# Patient Record
Sex: Female | Born: 1965 | Race: White | Hispanic: No | Marital: Married | Smoking: Former smoker
Health system: Southern US, Community
[De-identification: ages and names within clinical notes are randomized; demographics above are authoritative.]

## PROBLEM LIST (undated history)

## (undated) DIAGNOSIS — E559 Vitamin D deficiency, unspecified: Secondary | ICD-10-CM

## (undated) DIAGNOSIS — E039 Hypothyroidism, unspecified: Secondary | ICD-10-CM

## (undated) DIAGNOSIS — IMO0002 Reserved for concepts with insufficient information to code with codable children: Secondary | ICD-10-CM

## (undated) DIAGNOSIS — K829 Disease of gallbladder, unspecified: Secondary | ICD-10-CM

## (undated) DIAGNOSIS — N879 Dysplasia of cervix uteri, unspecified: Secondary | ICD-10-CM

## (undated) DIAGNOSIS — J302 Other seasonal allergic rhinitis: Secondary | ICD-10-CM

## (undated) DIAGNOSIS — Z780 Asymptomatic menopausal state: Secondary | ICD-10-CM

## (undated) DIAGNOSIS — T7840XA Allergy, unspecified, initial encounter: Secondary | ICD-10-CM

## (undated) DIAGNOSIS — F3281 Premenstrual dysphoric disorder: Secondary | ICD-10-CM

## (undated) DIAGNOSIS — S0291XA Unspecified fracture of skull, initial encounter for closed fracture: Secondary | ICD-10-CM

## (undated) DIAGNOSIS — E079 Disorder of thyroid, unspecified: Secondary | ICD-10-CM

## (undated) DIAGNOSIS — F32A Depression, unspecified: Secondary | ICD-10-CM

## (undated) DIAGNOSIS — R0602 Shortness of breath: Secondary | ICD-10-CM

## (undated) HISTORY — DX: Asymptomatic menopausal state: Z78.0

## (undated) HISTORY — DX: Unspecified fracture of skull, initial encounter for closed fracture: S02.91XA

## (undated) HISTORY — DX: Disease of gallbladder, unspecified: K82.9

## (undated) HISTORY — DX: Vitamin D deficiency, unspecified: E55.9

## (undated) HISTORY — DX: Premenstrual dysphoric disorder: F32.81

## (undated) HISTORY — PX: EYE SURGERY: SHX253

## (undated) HISTORY — PX: OTHER SURGICAL HISTORY: SHX169

## (undated) HISTORY — PX: COLPOSCOPY: SHX161

## (undated) HISTORY — DX: Reserved for concepts with insufficient information to code with codable children: IMO0002

## (undated) HISTORY — DX: Other seasonal allergic rhinitis: J30.2

## (undated) HISTORY — DX: Depression, unspecified: F32.A

## (undated) HISTORY — PX: CARPAL TUNNEL RELEASE: SHX101

## (undated) HISTORY — DX: Dysplasia of cervix uteri, unspecified: N87.9

## (undated) HISTORY — DX: Allergy, unspecified, initial encounter: T78.40XA

## (undated) HISTORY — PX: CHOLECYSTECTOMY: SHX55

## (undated) HISTORY — DX: Shortness of breath: R06.02

## (undated) HISTORY — DX: Hypothyroidism, unspecified: E03.9

---

## 1997-10-20 DIAGNOSIS — N879 Dysplasia of cervix uteri, unspecified: Secondary | ICD-10-CM

## 1997-10-20 HISTORY — DX: Dysplasia of cervix uteri, unspecified: N87.9

## 2000-02-14 ENCOUNTER — Other Ambulatory Visit: Admission: RE | Admit: 2000-02-14 | Discharge: 2000-02-14 | Payer: Self-pay | Admitting: Obstetrics and Gynecology

## 2000-05-04 ENCOUNTER — Other Ambulatory Visit: Admission: RE | Admit: 2000-05-04 | Discharge: 2000-05-04 | Payer: Self-pay | Admitting: Obstetrics and Gynecology

## 2000-05-05 ENCOUNTER — Ambulatory Visit (HOSPITAL_COMMUNITY): Admission: RE | Admit: 2000-05-05 | Discharge: 2000-05-05 | Payer: Self-pay | Admitting: Pulmonary Disease

## 2000-05-05 ENCOUNTER — Encounter: Payer: Self-pay | Admitting: Pulmonary Disease

## 2000-11-03 ENCOUNTER — Other Ambulatory Visit: Admission: RE | Admit: 2000-11-03 | Discharge: 2000-11-03 | Payer: Self-pay | Admitting: Obstetrics and Gynecology

## 2001-04-30 ENCOUNTER — Other Ambulatory Visit: Admission: RE | Admit: 2001-04-30 | Discharge: 2001-04-30 | Payer: Self-pay | Admitting: Obstetrics and Gynecology

## 2001-10-22 ENCOUNTER — Other Ambulatory Visit: Admission: RE | Admit: 2001-10-22 | Discharge: 2001-10-22 | Payer: Self-pay | Admitting: Obstetrics and Gynecology

## 2002-01-31 ENCOUNTER — Encounter: Payer: Self-pay | Admitting: *Deleted

## 2002-01-31 ENCOUNTER — Ambulatory Visit (HOSPITAL_COMMUNITY): Admission: RE | Admit: 2002-01-31 | Discharge: 2002-01-31 | Payer: Self-pay | Admitting: *Deleted

## 2002-01-31 ENCOUNTER — Emergency Department (HOSPITAL_COMMUNITY): Admission: EM | Admit: 2002-01-31 | Discharge: 2002-01-31 | Payer: Self-pay | Admitting: *Deleted

## 2002-02-28 ENCOUNTER — Encounter: Payer: Self-pay | Admitting: Surgery

## 2002-03-04 ENCOUNTER — Encounter: Payer: Self-pay | Admitting: Surgery

## 2002-03-04 ENCOUNTER — Ambulatory Visit (HOSPITAL_COMMUNITY): Admission: RE | Admit: 2002-03-04 | Discharge: 2002-03-05 | Payer: Self-pay | Admitting: Surgery

## 2002-03-04 ENCOUNTER — Encounter (INDEPENDENT_AMBULATORY_CARE_PROVIDER_SITE_OTHER): Payer: Self-pay | Admitting: Specialist

## 2002-05-04 ENCOUNTER — Other Ambulatory Visit: Admission: RE | Admit: 2002-05-04 | Discharge: 2002-05-04 | Payer: Self-pay | Admitting: Obstetrics and Gynecology

## 2002-11-23 ENCOUNTER — Other Ambulatory Visit: Admission: RE | Admit: 2002-11-23 | Discharge: 2002-11-23 | Payer: Self-pay | Admitting: Obstetrics and Gynecology

## 2003-02-15 ENCOUNTER — Encounter: Payer: Self-pay | Admitting: Obstetrics and Gynecology

## 2003-02-15 ENCOUNTER — Encounter: Admission: RE | Admit: 2003-02-15 | Discharge: 2003-02-15 | Payer: Self-pay | Admitting: Obstetrics and Gynecology

## 2003-08-01 ENCOUNTER — Other Ambulatory Visit: Admission: RE | Admit: 2003-08-01 | Discharge: 2003-08-01 | Payer: Self-pay | Admitting: Gynecology

## 2003-12-11 ENCOUNTER — Emergency Department (HOSPITAL_COMMUNITY): Admission: EM | Admit: 2003-12-11 | Discharge: 2003-12-11 | Payer: Self-pay | Admitting: Emergency Medicine

## 2004-08-21 ENCOUNTER — Other Ambulatory Visit: Admission: RE | Admit: 2004-08-21 | Discharge: 2004-08-21 | Payer: Self-pay | Admitting: Obstetrics and Gynecology

## 2005-03-19 ENCOUNTER — Ambulatory Visit: Payer: Self-pay | Admitting: Pulmonary Disease

## 2005-09-02 ENCOUNTER — Other Ambulatory Visit: Admission: RE | Admit: 2005-09-02 | Discharge: 2005-09-02 | Payer: Self-pay | Admitting: Obstetrics and Gynecology

## 2006-09-09 ENCOUNTER — Other Ambulatory Visit: Admission: RE | Admit: 2006-09-09 | Discharge: 2006-09-09 | Payer: Self-pay | Admitting: Addiction Medicine

## 2007-11-02 ENCOUNTER — Other Ambulatory Visit: Admission: RE | Admit: 2007-11-02 | Discharge: 2007-11-02 | Payer: Self-pay | Admitting: Obstetrics and Gynecology

## 2007-12-30 ENCOUNTER — Encounter: Payer: Self-pay | Admitting: Adult Health

## 2008-02-18 DIAGNOSIS — F411 Generalized anxiety disorder: Secondary | ICD-10-CM | POA: Insufficient documentation

## 2008-02-18 DIAGNOSIS — E039 Hypothyroidism, unspecified: Secondary | ICD-10-CM | POA: Insufficient documentation

## 2008-02-21 ENCOUNTER — Ambulatory Visit: Payer: Self-pay | Admitting: Internal Medicine

## 2008-02-21 ENCOUNTER — Encounter: Payer: Self-pay | Admitting: Adult Health

## 2008-02-21 DIAGNOSIS — R209 Unspecified disturbances of skin sensation: Secondary | ICD-10-CM | POA: Insufficient documentation

## 2008-02-21 DIAGNOSIS — R5383 Other fatigue: Secondary | ICD-10-CM

## 2008-02-21 DIAGNOSIS — R5381 Other malaise: Secondary | ICD-10-CM

## 2008-02-22 ENCOUNTER — Encounter: Admission: RE | Admit: 2008-02-22 | Discharge: 2008-02-22 | Payer: Self-pay | Admitting: Obstetrics and Gynecology

## 2008-02-23 LAB — CONVERTED CEMR LAB: Vit D, 1,25-Dihydroxy: 32 (ref 30–89)

## 2008-02-24 LAB — CONVERTED CEMR LAB
Albumin: 3.6 g/dL (ref 3.5–5.2)
BUN: 10 mg/dL (ref 6–23)
Basophils Absolute: 0 10*3/uL (ref 0.0–0.1)
Basophils Relative: 0.1 % (ref 0.0–1.0)
Calcium: 9.2 mg/dL (ref 8.4–10.5)
Creatinine, Ser: 0.6 mg/dL (ref 0.4–1.2)
Eosinophils Absolute: 0.2 10*3/uL (ref 0.0–0.7)
GFR calc Af Amer: 143 mL/min
Glucose, Bld: 94 mg/dL (ref 70–99)
Hemoglobin: 14.3 g/dL (ref 12.0–15.0)
MCHC: 34.4 g/dL (ref 30.0–36.0)
MCV: 88.2 fL (ref 78.0–100.0)
Neutro Abs: 4 10*3/uL (ref 1.4–7.7)
RBC: 4.72 M/uL (ref 3.87–5.11)
RDW: 11.4 % — ABNORMAL LOW (ref 11.5–14.6)
Sed Rate: 21 mm/hr (ref 0–22)
Total Protein: 7.2 g/dL (ref 6.0–8.3)
Vitamin B-12: 758 pg/mL (ref 211–911)

## 2008-11-08 ENCOUNTER — Ambulatory Visit: Payer: Self-pay | Admitting: Obstetrics and Gynecology

## 2008-11-08 ENCOUNTER — Encounter: Payer: Self-pay | Admitting: Obstetrics and Gynecology

## 2008-11-08 ENCOUNTER — Other Ambulatory Visit: Admission: RE | Admit: 2008-11-08 | Discharge: 2008-11-08 | Payer: Self-pay | Admitting: Obstetrics and Gynecology

## 2009-01-08 ENCOUNTER — Encounter: Payer: Self-pay | Admitting: Pulmonary Disease

## 2009-02-27 ENCOUNTER — Encounter: Payer: Self-pay | Admitting: Pulmonary Disease

## 2009-03-01 ENCOUNTER — Encounter: Admission: RE | Admit: 2009-03-01 | Discharge: 2009-03-01 | Payer: Self-pay | Admitting: Obstetrics and Gynecology

## 2009-05-15 ENCOUNTER — Encounter: Payer: Self-pay | Admitting: Pulmonary Disease

## 2009-07-24 ENCOUNTER — Encounter: Payer: Self-pay | Admitting: Pulmonary Disease

## 2009-11-09 ENCOUNTER — Other Ambulatory Visit: Admission: RE | Admit: 2009-11-09 | Discharge: 2009-11-09 | Payer: Self-pay | Admitting: Obstetrics and Gynecology

## 2009-11-09 ENCOUNTER — Ambulatory Visit: Payer: Self-pay | Admitting: Obstetrics and Gynecology

## 2010-03-06 ENCOUNTER — Encounter: Admission: RE | Admit: 2010-03-06 | Discharge: 2010-03-06 | Payer: Self-pay | Admitting: Obstetrics and Gynecology

## 2010-11-12 ENCOUNTER — Ambulatory Visit
Admission: RE | Admit: 2010-11-12 | Discharge: 2010-11-12 | Payer: Self-pay | Source: Home / Self Care | Attending: Obstetrics and Gynecology | Admitting: Obstetrics and Gynecology

## 2010-11-12 ENCOUNTER — Other Ambulatory Visit: Payer: Self-pay | Admitting: Obstetrics and Gynecology

## 2010-11-12 ENCOUNTER — Other Ambulatory Visit
Admission: RE | Admit: 2010-11-12 | Discharge: 2010-11-12 | Payer: Self-pay | Source: Home / Self Care | Admitting: Obstetrics and Gynecology

## 2011-02-26 ENCOUNTER — Other Ambulatory Visit: Payer: Self-pay | Admitting: Obstetrics and Gynecology

## 2011-02-26 DIAGNOSIS — Z1231 Encounter for screening mammogram for malignant neoplasm of breast: Secondary | ICD-10-CM

## 2011-03-07 NOTE — Procedures (Signed)
[place]  Patient:    Linda Berger, Linda Berger Visit Number: [id number] MRN: [id number]          Service Type: DSU Attending Physician:  Valarie Merino Md Dictated by:   Thornton Park Martin,M.D. Proc. Date: 03/04/02 Admit Date:  03/04/2002 Discharge Date: 03/05/2002   CC:         [name], M.D. [facility]   Procedure Report  CCS# [id number]  PREOPERATIVE DIAGNOSIS:  History of cholecystitis with cholelithiasis.  POSTOPERATIVE DIAGNOSIS:  History of cholecystitis with cholelithiasis.  OPERATION PERFORMED:  Laparoscopic cholecystectomy with intraoperative cholangiogram.  SURGEON:  Thornton Park. Daphine Deutscher, M.D.  ASSISTANT:  Sharlet Salina T. Hoxworth, M.D.  ANESTHESIA:  General endotracheal.  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  Liver function studies were unremarkable at that time.  She was seen in the office on February 01, 2002 and informed consent was obtained regarding lap and open cholecystectomy.  DESCRIPTION OF PROCEDURE:  With the patient asleep, the abdomen was prepped with Betadine and draped sterilely.  A longitudinal incision was made in the umbilicus and through this, a suture was placed to hold the Hasson in place and this was inserted without difficulty.  Three other trocars were placed in the upperabdomen.  The gallbladder was grasped and elevated and Calots triangle was dissected and a clip was placed up on the gallbladder.  This was incised.  A Reddick catheter was inserted into the cystic duct.  A dynamic cholangiogram was performed which showed a small cystic duct with filling of a small common duct with free flow into the duodenum and good intrahepatic filling demonstrating right and left intrahepatic ducts.  No evidence of any filling defects.  The cystic  duct was then triple clipped and divided.The cystic artery was double clipped and divided and the gallbladder wasremoved without entering it from the gallbladder bed.  Hemostasis was achieved throughout the dissection.  No bile leaks were noted as the area was irrigated at the end of the cholecystectomy.  The gallbladder was brought out through the umbilicus without difficulty.  The umbilical port site had been created through a small umbilical hernia which had been enlarged to accommodate the trocar.  This hole was repaired using the Endocloseand a 0 Vicryl suture which was placed in a simple fashion which obliterated the defect.  The port sites were injected with 0.5% Marcaine and theskin was closed with 4-0 Vicryl subcuticular with benzoin and Steri-Strips.  The patient seemed to tolerate the procedure well and was taken to the recovery room in satisfactory condition. Dictated by:   Thornton Park Daphine Deutscher, M.D. Attending Physician:  Valarie Merino Md DD:  03/04/02 TD:  03/07/02 Job: [id number]

## 2011-03-11 ENCOUNTER — Ambulatory Visit: Payer: Self-pay

## 2011-03-25 ENCOUNTER — Other Ambulatory Visit (INDEPENDENT_AMBULATORY_CARE_PROVIDER_SITE_OTHER): Payer: BC Managed Care – PPO

## 2011-03-25 ENCOUNTER — Ambulatory Visit
Admission: RE | Admit: 2011-03-25 | Discharge: 2011-03-25 | Disposition: A | Discharge status: Home / Self Care | Payer: BC Managed Care – PPO | Source: Ambulatory Visit | Attending: Obstetrics and Gynecology | Admitting: Obstetrics and Gynecology

## 2011-03-25 DIAGNOSIS — Z1231 Encounter for screening mammogram for malignant neoplasm of breast: Secondary | ICD-10-CM

## 2011-03-25 DIAGNOSIS — E039 Hypothyroidism, unspecified: Secondary | ICD-10-CM

## 2011-03-27 ENCOUNTER — Other Ambulatory Visit: Payer: Self-pay | Admitting: Obstetrics and Gynecology

## 2011-03-27 DIAGNOSIS — Z1231 Encounter for screening mammogram for malignant neoplasm of breast: Secondary | ICD-10-CM

## 2011-04-08 ENCOUNTER — Inpatient Hospital Stay (INDEPENDENT_AMBULATORY_CARE_PROVIDER_SITE_OTHER)
Admission: RE | Admit: 2011-04-08 | Discharge: 2011-04-08 | Disposition: A | Discharge status: Home / Self Care | Payer: BC Managed Care – PPO | Source: Ambulatory Visit

## 2011-04-08 DIAGNOSIS — K5289 Other specified noninfective gastroenteritis and colitis: Secondary | ICD-10-CM

## 2011-04-08 LAB — POCT I-STAT, CHEM 8
Creatinine, Ser: 0.6 mg/dL (ref 0.50–1.10)
HCT: 47 % — ABNORMAL HIGH (ref 36.0–46.0)
Hemoglobin: 16 g/dL — ABNORMAL HIGH (ref 12.0–15.0)
Sodium: 136 mEq/L (ref 135–145)
TCO2: 17 mmol/L (ref 0–100)

## 2011-04-10 ENCOUNTER — Ambulatory Visit: Payer: BC Managed Care – PPO

## 2011-10-17 ENCOUNTER — Emergency Department (HOSPITAL_COMMUNITY)
Admission: EM | Admit: 2011-10-17 | Discharge: 2011-10-17 | Disposition: A | Discharge status: Home / Self Care | Payer: BC Managed Care – PPO | Source: Home / Self Care

## 2011-10-17 DIAGNOSIS — J209 Acute bronchitis, unspecified: Secondary | ICD-10-CM

## 2011-10-17 HISTORY — DX: Disorder of thyroid, unspecified: E07.9

## 2011-10-17 MED ORDER — PROMETHAZINE-CODEINE 6.25-10 MG/5ML PO SYRP: ORAL_SOLUTION | ORAL | Status: AC

## 2011-10-17 MED ORDER — ALBUTEROL SULFATE HFA 108 (90 BASE) MCG/ACT IN AERS: 2.0000 | INHALATION_SPRAY | RESPIRATORY_TRACT | Status: DC | PRN

## 2011-10-17 NOTE — ED Notes (Signed)
C/o nonproductive cough, headache, pressure behind eyes, bil. Ear pain, chills and clear secretions from nose since 10/14/11.  Taking mucinex D and DM along with tylenol for sx.

## 2011-10-17 NOTE — ED Provider Notes (Signed)
History     CSN: [id number]  Arrival date & time 10/17/11  1478   None     Chief Complaint  Patient presents with  . Cough  . Headache  . Otalgia    (Consider location/radiation/quality/duration/timing/severity/associated sxs/prior treatment) HPI Comments: Onset of cough 2 days ago. Cough is nonproductive. No relief with otc cough products, and is disruptive to sleep at night. Pt also has chills, but no fever, frontal HA - worse with cough, and mild clear nasal congestion. No myalgias. No hx of asthma. She denies dyspnea or wheezing.    The history is provided by the patient.    Past Medical History  Diagnosis Date  . Thyroid disease     Past Surgical History  Procedure Date  . Cholecystectomy   . Eye surgery     History reviewed. No pertinent family history.  History  Substance Use Topics  . Smoking status: Never Smoker   . Smokeless tobacco: Not on file  . Alcohol Use: Yes     social    OB History    Grav Para Term Preterm Abortions TAB SAB Ect Mult Living                  Review of Systems  Constitutional: Positive for chills and fatigue. Negative for fever.  HENT: Positive for ear pain and congestion. Negative for sore throat, rhinorrhea, postnasal drip and sinus pressure.   Respiratory: Positive for cough. Negative for shortness of breath and wheezing.   Cardiovascular: Negative for chest pain.    Allergies  Aspirin and Erythromycin  Home Medications   Current Outpatient Rx  Name Route Sig Dispense Refill  . DROSPIRENONE-ETHINYL ESTRADIOL 3-0.02 MG PO TABS Oral Take 1 tablet by mouth daily.      Marland Kitchen LEVOTHYROXINE SODIUM 112 MCG PO TABS Oral Take 112 mcg by mouth daily.      . ALBUTEROL SULFATE HFA 108 (90 BASE) MCG/ACT IN AERS Inhalation Inhale 2 puffs into the lungs every 4 (four) hours as needed for wheezing. 1 Inhaler 0  . PROMETHAZINE-CODEINE 6.25-10 MG/5ML PO SYRP  1-2 tsp every 6 hrs prn cough 120 mL 0    BP 118/86  Pulse 108  Temp(Src)  98.4 F (36.9 C) (Oral)  Resp 18  SpO2 98%  Physical Exam  Nursing note and vitals reviewed. Constitutional: She appears well-developed and well-nourished. No distress.       NAD, does appear ill. Deep  cough noted frequently during visit.   HENT:  Head: Normocephalic and atraumatic.  Right Ear: Tympanic membrane, external ear and ear canal normal.  Left Ear: Tympanic membrane, external ear and ear canal normal.  Nose: Nose normal.  Mouth/Throat: Uvula is midline, oropharynx is clear and moist and mucous membranes are normal. No oropharyngeal exudate, posterior oropharyngeal edema or posterior oropharyngeal erythema.  Neck: Neck supple.  Cardiovascular: Normal rate, regular rhythm and normal heart sounds.   Pulmonary/Chest: Effort normal and breath sounds normal. No respiratory distress.  Lymphadenopathy:    She has no cervical adenopathy.  Neurological: She is alert.  Skin: Skin is warm and dry.  Psychiatric: She has a normal mood and affect.    ED Course  Procedures (including critical care time)  Labs Reviewed - No data to display No results found.   1. Acute bronchitis       MDM  Nonproductive cough, afebrile. VSS, Lungs CTA.         Melody Comas, [place] 10/17/11 [phone]

## 2011-10-21 NOTE — ED Provider Notes (Signed)
Medical screening examination/treatment/procedure(s) were performed by non-physician practitioner and as supervising physician I was immediately available for consultation/collaboration.   KINDL,JAMES DOUGLAS MD.    James Douglas Kindl, MD 10/21/11 1401 

## 2011-12-02 ENCOUNTER — Encounter: Payer: Self-pay | Admitting: Gynecology

## 2011-12-02 ENCOUNTER — Other Ambulatory Visit: Payer: Self-pay | Admitting: Obstetrics and Gynecology

## 2011-12-02 DIAGNOSIS — S0291XA Unspecified fracture of skull, initial encounter for closed fracture: Secondary | ICD-10-CM | POA: Insufficient documentation

## 2011-12-09 ENCOUNTER — Other Ambulatory Visit (HOSPITAL_COMMUNITY)
Admission: RE | Admit: 2011-12-09 | Discharge: 2011-12-09 | Disposition: A | Discharge status: Home / Self Care | Payer: BC Managed Care – PPO | Source: Ambulatory Visit | Attending: Obstetrics and Gynecology | Admitting: Obstetrics and Gynecology

## 2011-12-09 ENCOUNTER — Ambulatory Visit (INDEPENDENT_AMBULATORY_CARE_PROVIDER_SITE_OTHER): Payer: BC Managed Care – PPO | Admitting: Obstetrics and Gynecology

## 2011-12-09 ENCOUNTER — Encounter: Payer: Self-pay | Admitting: Obstetrics and Gynecology

## 2011-12-09 ENCOUNTER — Ambulatory Visit
Admission: RE | Admit: 2011-12-09 | Discharge: 2011-12-09 | Disposition: A | Discharge status: Home / Self Care | Payer: BC Managed Care – PPO | Source: Ambulatory Visit | Attending: Obstetrics and Gynecology | Admitting: Obstetrics and Gynecology

## 2011-12-09 VITALS — BP 126/80 | Ht 67.0 in | Wt 204.0 lb

## 2011-12-09 DIAGNOSIS — E039 Hypothyroidism, unspecified: Secondary | ICD-10-CM

## 2011-12-09 DIAGNOSIS — Z01419 Encounter for gynecological examination (general) (routine) without abnormal findings: Secondary | ICD-10-CM

## 2011-12-09 DIAGNOSIS — N879 Dysplasia of cervix uteri, unspecified: Secondary | ICD-10-CM | POA: Insufficient documentation

## 2011-12-09 DIAGNOSIS — Z1231 Encounter for screening mammogram for malignant neoplasm of breast: Secondary | ICD-10-CM

## 2011-12-09 LAB — URINALYSIS W MICROSCOPIC + REFLEX CULTURE
Bilirubin Urine: NEGATIVE
Protein, ur: NEGATIVE mg/dL
Urobilinogen, UA: 0.2 mg/dL (ref 0.0–1.0)

## 2011-12-09 MED ORDER — DROSPIRENONE-ETHINYL ESTRADIOL 3-0.02 MG PO TABS: ORAL_TABLET | ORAL | Status: DC

## 2011-12-09 MED ORDER — ALPRAZOLAM 0.25 MG PO TABS: 0.2500 mg | ORAL_TABLET | Freq: Every evening | ORAL | Status: AC | PRN

## 2011-12-09 MED ORDER — LEVOTHYROXINE SODIUM 112 MCG PO TABS: 112.0000 ug | ORAL_TABLET | Freq: Every day | ORAL | Status: DC

## 2011-12-09 NOTE — Progress Notes (Signed)
Patient came to see me today for her annual GYN exam. She takes are birth-control pills continuously and has no periods. She is having more trouble with PMDD and requested medication. She had previously tried Zoloft but found that it affected sexual function. She would like to do something on a when necessary basis rather than daily. She is having her mammogram today. We are treating her for hypothyroidism. She is doing well on her current dose is Synthroid. She has had all her lab work done at work except for [employer].  Physical examination: Kennon Portela present. HEENT within normal limits. Neck: Thyroid not large. No masses. Supraclavicular nodes: not enlarged. Breasts: Examined in both sitting midline position. No skin changes and no masses. Abdomen: Soft no guarding rebound or masses or hernia. Pelvic: External: Within normal limits. BUS: Within normal limits. Vaginal:within normal limits. Good estrogen effect. No evidence of cystocele rectocele or enterocele. Cervix: clean. Uterus: Normal size and shape. Adnexa: No masses. Rectovaginal exam: Confirmatory and negative. Extremities: Within normal limits.  Assessment: PMDD. Hypothyroidism.  Plan: Had a repeat conversation with the patient about Yaz an increased risk of DVT. She would like to continue as is and accepts the risks. We started her on Xanax 0.25 mg as needed. TSH checked. Continue Synthroid. Mammogram.

## 2011-12-10 LAB — TSH: TSH: 0.956 u[IU]/mL (ref 0.350–4.500)

## 2011-12-12 ENCOUNTER — Other Ambulatory Visit: Payer: Self-pay | Admitting: *Deleted

## 2011-12-12 MED ORDER — DROSPIRENONE-ETHINYL ESTRADIOL 3-0.02 MG PO TABS: ORAL_TABLET | ORAL | Status: DC

## 2012-05-26 ENCOUNTER — Telehealth: Payer: Self-pay | Admitting: *Deleted

## 2012-05-26 MED ORDER — LEVOTHYROXINE SODIUM 125 MCG PO TABS: 125.0000 ug | ORAL_TABLET | Freq: Every day | ORAL | Status: DC

## 2012-05-26 NOTE — Telephone Encounter (Signed)
PT HAS HER LAB WORK DONE AT [facility] AND YOU KEEP TRACK OF HER TSH LEVEL. PT CALLED TO LET YOU KNOW TSH ELEVATED AT 7.910 SHE IS CURRENTLY TAKING SYNTHROID 112 MCG DAILY. PT ASKED IF RX SHOULD BE INCREASED? PLEASE ADVISE

## 2012-05-26 NOTE — Telephone Encounter (Signed)
I would increase it to 125 MCG daily. She needs followup TSH in 10 weeks.

## 2012-05-26 NOTE — Telephone Encounter (Signed)
PT INFORMED WITH THE BELOW NOTE, RX SENT, RECALL LETTER IN COMPUTER.

## 2012-07-20 ENCOUNTER — Other Ambulatory Visit: Payer: Self-pay | Admitting: *Deleted

## 2012-07-20 DIAGNOSIS — E039 Hypothyroidism, unspecified: Secondary | ICD-10-CM

## 2012-07-21 ENCOUNTER — Other Ambulatory Visit: Payer: Self-pay | Admitting: *Deleted

## 2012-07-21 DIAGNOSIS — E039 Hypothyroidism, unspecified: Secondary | ICD-10-CM

## 2012-07-27 ENCOUNTER — Other Ambulatory Visit: Payer: BC Managed Care – PPO

## 2012-08-13 ENCOUNTER — Other Ambulatory Visit: Payer: BC Managed Care – PPO

## 2012-08-13 DIAGNOSIS — E039 Hypothyroidism, unspecified: Secondary | ICD-10-CM

## 2012-10-21 ENCOUNTER — Other Ambulatory Visit: Payer: Self-pay | Admitting: Gynecology

## 2012-10-21 DIAGNOSIS — Z1231 Encounter for screening mammogram for malignant neoplasm of breast: Secondary | ICD-10-CM

## 2012-10-24 ENCOUNTER — Other Ambulatory Visit: Payer: Self-pay | Admitting: Obstetrics and Gynecology

## 2012-12-09 ENCOUNTER — Encounter: Payer: BC Managed Care – PPO | Admitting: Gynecology

## 2012-12-09 ENCOUNTER — Ambulatory Visit: Payer: BC Managed Care – PPO

## 2012-12-15 ENCOUNTER — Ambulatory Visit
Admission: RE | Admit: 2012-12-15 | Discharge: 2012-12-15 | Disposition: A | Discharge status: Home / Self Care | Payer: BC Managed Care – PPO | Source: Ambulatory Visit | Attending: Gynecology | Admitting: Gynecology

## 2012-12-15 ENCOUNTER — Encounter: Payer: Self-pay | Admitting: Gynecology

## 2012-12-15 ENCOUNTER — Ambulatory Visit (INDEPENDENT_AMBULATORY_CARE_PROVIDER_SITE_OTHER): Payer: BC Managed Care – PPO | Admitting: Gynecology

## 2012-12-15 VITALS — BP 120/76 | Ht 67.0 in | Wt 156.0 lb

## 2012-12-15 DIAGNOSIS — Z01419 Encounter for gynecological examination (general) (routine) without abnormal findings: Secondary | ICD-10-CM

## 2012-12-15 LAB — CBC WITH DIFFERENTIAL/PLATELET
Basophils Relative: 0 % (ref 0–1)
Eosinophils Absolute: 0.1 10*3/uL (ref 0.0–0.7)
Hemoglobin: 14.4 g/dL (ref 12.0–15.0)
MCH: 29.8 pg (ref 26.0–34.0)
MCHC: 35.4 g/dL (ref 30.0–36.0)
Monocytes Absolute: 0.5 10*3/uL (ref 0.1–1.0)
Monocytes Relative: 6 % (ref 3–12)
Neutrophils Relative %: 51 % (ref 43–77)

## 2012-12-15 MED ORDER — LEVOTHYROXINE SODIUM 125 MCG PO TABS: 125.0000 ug | ORAL_TABLET | Freq: Every day | ORAL | Status: DC

## 2012-12-15 MED ORDER — DROSPIRENONE-ETHINYL ESTRADIOL 3-0.02 MG PO TABS: ORAL_TABLET | ORAL | Status: DC

## 2012-12-15 NOTE — Patient Instructions (Signed)
Follow up one year for annual exam, sooner if any issues

## 2012-12-15 NOTE — Progress Notes (Signed)
Linda Berger 11/07/1965 [id number]        47 y.o.  G0P0 for annual exam.  Former patient of Dr. Eda Berger. Doing well without issues.  Past medical history,surgical history, medications, allergies, family history and social history were all reviewed and documented in the EPIC chart. ROS:  Was performed and pertinent positives and negatives are included in the history.  Exam: Linda Berger assistant Filed Vitals:   12/15/12 1429  BP: 120/76  Height: 5\' 7"  (1.702 m)  Weight: 156 lb (70.761 kg)   General appearance  Normal Skin grossly normal Head/Neck normal with no cervical or supraclavicular adenopathy thyroid normal Lungs  clear Cardiac RR, without RMG Abdominal  soft, nontender, without masses, organomegaly or hernia Breasts  examined lying and sitting without masses, retractions, discharge or axillary adenopathy. Pelvic  Ext/BUS/vagina  normal   Cervix  normal   Uterus  anteverted, normal size, shape and contour, midline and mobile nontender   Adnexa  Without masses or tenderness    Anus and perineum  normal   Rectovaginal  normal sphincter tone without palpated masses or tenderness.    Assessment/Plan:  47 y.o. G0P0 female for annual exam.   1. History of significant PMDD. Currently on Yaz BCPs continuously and doing well with this. She does have a history of cigarette use, quit approximately 15 years ago. Has been on Yaz for a number of years. I reviewed the issue of increased risk of thrombosis associated with BCPs, additional risk with Yaz and possible additional risk due to prior cigarette use. After lengthy discussion she wants to continue noting quality of life was terrible before due to PMDD and now is doing well with this. I refilled her times a year. 2. Hypothyroid. Dr. Eda Berger has been managing this. We'll check TSH and our refill her Synthroid 125 mcg x1 year. 3. Mammography this morning. Continue with annual mammography. SBE monthly reviewed. 4. Pap smear 2013. No Pap  smear done today. History of cervical dysplasia in 1999, no treatment. 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 We'll plan repeat Pap smear in 3 year interval. 5. Health maintenance. Patient actively sees Dr. Alroy Berger for health care. Recently had blood work to include lipid screening and glucose screening through work which were normal. Will check baseline CBC and urinalysis along with her TSH. Follow up one year, sooner as needed.   Dara Lords MD, 2:52 PM 12/15/2012

## 2012-12-16 ENCOUNTER — Other Ambulatory Visit: Payer: Self-pay | Admitting: Gynecology

## 2012-12-16 ENCOUNTER — Other Ambulatory Visit: Payer: Self-pay

## 2012-12-16 LAB — URINALYSIS W MICROSCOPIC + REFLEX CULTURE
Bacteria, UA: NONE SEEN
Bilirubin Urine: NEGATIVE
Casts: NONE SEEN
Glucose, UA: NEGATIVE mg/dL
Hgb urine dipstick: NEGATIVE
Ketones, ur: NEGATIVE mg/dL
pH: 6 (ref 5.0–8.0)

## 2012-12-16 MED ORDER — LEVOTHYROXINE SODIUM 112 MCG PO TABS: 112.0000 ug | ORAL_TABLET | Freq: Every day | ORAL | Status: DC

## 2012-12-17 ENCOUNTER — Other Ambulatory Visit: Payer: Self-pay | Admitting: Gynecology

## 2012-12-17 ENCOUNTER — Other Ambulatory Visit: Payer: Self-pay | Admitting: *Deleted

## 2012-12-17 DIAGNOSIS — N6310 Unspecified lump in the right breast, unspecified quadrant: Secondary | ICD-10-CM

## 2012-12-17 DIAGNOSIS — R928 Other abnormal and inconclusive findings on diagnostic imaging of breast: Secondary | ICD-10-CM

## 2012-12-30 ENCOUNTER — Ambulatory Visit
Admission: RE | Admit: 2012-12-30 | Discharge: 2012-12-30 | Disposition: A | Discharge status: Home / Self Care | Payer: BC Managed Care – PPO | Source: Ambulatory Visit | Attending: Gynecology | Admitting: Gynecology

## 2013-02-27 ENCOUNTER — Other Ambulatory Visit: Payer: Self-pay | Admitting: Gynecology

## 2013-03-01 NOTE — Telephone Encounter (Signed)
LM for pt to call, she is due for a thyroid check before refill. KW

## 2013-03-03 NOTE — Telephone Encounter (Signed)
I talked to pt and she was informed again that she needs her TSH drawn.

## 2013-05-24 ENCOUNTER — Other Ambulatory Visit: Payer: BC Managed Care – PPO

## 2013-05-24 DIAGNOSIS — E039 Hypothyroidism, unspecified: Secondary | ICD-10-CM

## 2013-05-26 ENCOUNTER — Other Ambulatory Visit: Payer: Self-pay | Admitting: Gynecology

## 2013-12-13 ENCOUNTER — Other Ambulatory Visit: Payer: Self-pay

## 2013-12-13 DIAGNOSIS — Z1231 Encounter for screening mammogram for malignant neoplasm of breast: Secondary | ICD-10-CM

## 2013-12-19 ENCOUNTER — Other Ambulatory Visit (HOSPITAL_COMMUNITY)
Admission: RE | Admit: 2013-12-19 | Discharge: 2013-12-19 | Disposition: A | Discharge status: Home / Self Care | Payer: BC Managed Care – PPO | Source: Ambulatory Visit | Attending: Gynecology | Admitting: Gynecology

## 2013-12-19 ENCOUNTER — Encounter: Payer: Self-pay | Admitting: Gynecology

## 2013-12-19 ENCOUNTER — Ambulatory Visit (INDEPENDENT_AMBULATORY_CARE_PROVIDER_SITE_OTHER): Payer: BC Managed Care – PPO | Admitting: Gynecology

## 2013-12-19 VITALS — BP 116/74 | Ht 68.0 in | Wt 153.0 lb

## 2013-12-19 DIAGNOSIS — Z01419 Encounter for gynecological examination (general) (routine) without abnormal findings: Secondary | ICD-10-CM | POA: Insufficient documentation

## 2013-12-19 DIAGNOSIS — F3281 Premenstrual dysphoric disorder: Secondary | ICD-10-CM

## 2013-12-19 DIAGNOSIS — Z1151 Encounter for screening for human papillomavirus (HPV): Secondary | ICD-10-CM | POA: Insufficient documentation

## 2013-12-19 DIAGNOSIS — N943 Premenstrual tension syndrome: Secondary | ICD-10-CM

## 2013-12-19 MED ORDER — DROSPIRENONE-ETHINYL ESTRADIOL 3-0.02 MG PO TABS: ORAL_TABLET | ORAL | Status: DC

## 2013-12-19 MED ORDER — LEVOTHYROXINE SODIUM 112 MCG PO TABS: ORAL_TABLET | ORAL | Status: DC

## 2013-12-19 NOTE — Progress Notes (Signed)
Linda Berger 08/07/1966 [id number]        48 y.o.  G0P0 for annual exam.  Several issues noted below.  Past medical history,surgical history, problem list, medications, allergies, family history and social history were all reviewed and documented in the EPIC chart.  ROS:  Performed and pertinent positives and negatives are included in the history, assessment and plan .  Exam: [name] Filed Vitals:   12/19/13 1407  BP: 116/74  Height: 5\' 8"  (1.727 m)  Weight: 153 lb (69.4 kg)   General appearance  Normal Skin grossly normal Head/Neck normal with no cervical or supraclavicular adenopathy thyroid normal Lungs  clear Cardiac RR, without RMG Abdominal  soft, nontender, without masses, organomegaly or hernia Breasts  examined lying and sitting without masses, retractions, discharge or axillary adenopathy. Pelvic  Ext/BUS/vagina normal  Cervix normal  Uterus anteverted, normal size, shape and contour, midline and mobile nontender   Adnexa  Without masses or tenderness    Anus and perineum  Normal   Rectovaginal  Normal sphincter tone without palpated masses or tenderness.    Assessment/Plan:  48 y.o. G0P0 female for annual exam amenorrheic, continuous BCPs/vasectomy.   1. The patient using Yaz BCPs continuously for menstrual suppression/PMDD suppression. Also using vasectomy. Discussed options to include stopping pills now monitoring symptoms. Addition of anxiolytic such as Sarafem. Continuing BCPs. Risks to include increased risk of stroke heart attack DVT particularly with Yaz and her past smoking history although has quit since 2001 all reviewed. Patient wants to continue present course and accepts the risks and her BCPs were refilled x1 year. 2. Hypothyroid. On Synthroid 112 mcg daily which we prescribed. Check TSH and refill x1 year. Doing well without complaints. 3. Mammography scheduled and she will followup for this as she is due now. SBE monthly reviewed. 4. Pap smear  2013 normal but without endocervical cells. Pap/HPV today.  History of cervical dysplasia in 1999, no treatment. 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 5. Health maintenance. No routine blood work done as patient reports this all done through her primary physician's office. Followup one year, sooner as needed.   Note: This document was prepared with digital dictation and possible smart phrase technology. Any transcriptional errors that result from this process are unintentional.   Dara LordsFONTAINE,Emeterio Balke P MD, 2:49 PM 12/19/2013

## 2013-12-19 NOTE — Patient Instructions (Signed)
Followup for your mammogram as scheduled. Followup in one year for annual exam, sooner as needed.

## 2013-12-20 LAB — URINALYSIS W MICROSCOPIC + REFLEX CULTURE
BILIRUBIN URINE: NEGATIVE
CASTS: NONE SEEN
CRYSTALS: NONE SEEN
GLUCOSE, UA: NEGATIVE mg/dL
HGB URINE DIPSTICK: NEGATIVE
KETONES UR: NEGATIVE mg/dL
Leukocytes, UA: NEGATIVE
NITRITE: NEGATIVE
PH: 6 (ref 5.0–8.0)
Protein, ur: NEGATIVE mg/dL
SPECIFIC GRAVITY, URINE: 1.005 (ref 1.005–1.030)
Squamous Epithelial / LPF: NONE SEEN
Urobilinogen, UA: 0.2 mg/dL (ref 0.0–1.0)

## 2013-12-20 LAB — TSH: TSH: 2.748 u[IU]/mL (ref 0.350–4.500)

## 2013-12-28 ENCOUNTER — Ambulatory Visit: Payer: BC Managed Care – PPO

## 2014-03-03 ENCOUNTER — Ambulatory Visit
Admission: RE | Admit: 2014-03-03 | Discharge: 2014-03-03 | Disposition: A | Discharge status: Home / Self Care | Payer: BC Managed Care – PPO | Source: Ambulatory Visit

## 2014-03-03 ENCOUNTER — Encounter (INDEPENDENT_AMBULATORY_CARE_PROVIDER_SITE_OTHER): Payer: Self-pay

## 2014-03-03 DIAGNOSIS — Z1231 Encounter for screening mammogram for malignant neoplasm of breast: Secondary | ICD-10-CM

## 2014-05-09 ENCOUNTER — Telehealth: Payer: Self-pay | Admitting: *Deleted

## 2014-05-09 NOTE — Telephone Encounter (Signed)
Pt asked if you know a physcian to refer for PMDD? If so pt would like referral. Please advise

## 2014-05-09 NOTE — Telephone Encounter (Signed)
I do not know of a specific person who specializes in this. [facility]

## 2014-05-11 NOTE — Telephone Encounter (Signed)
Left message for pt to call.

## 2014-05-11 NOTE — Telephone Encounter (Signed)
Patient informed. 

## 2014-09-19 ENCOUNTER — Ambulatory Visit: Payer: BC Managed Care – PPO | Admitting: Family Medicine

## 2014-10-31 ENCOUNTER — Ambulatory Visit: Payer: BC Managed Care – PPO | Admitting: Internal Medicine

## 2014-11-14 ENCOUNTER — Telehealth: Payer: Self-pay | Admitting: *Deleted

## 2014-11-14 MED ORDER — DROSPIRENONE-ETHINYL ESTRADIOL 3-0.02 MG PO TABS: ORAL_TABLET | ORAL | Status: DC

## 2014-11-14 MED ORDER — LEVOTHYROXINE SODIUM 112 MCG PO TABS: ORAL_TABLET | ORAL | Status: DC

## 2014-11-14 NOTE — Telephone Encounter (Signed)
Pt has annual scheduled on 12/27/14 will need refill on synthroid 112 mcg and loryna birth control pills, Rx sent until annual.

## 2014-12-27 ENCOUNTER — Ambulatory Visit (INDEPENDENT_AMBULATORY_CARE_PROVIDER_SITE_OTHER): Payer: Managed Care, Other (non HMO) | Admitting: Gynecology

## 2014-12-27 ENCOUNTER — Encounter: Payer: Self-pay | Admitting: Gynecology

## 2014-12-27 VITALS — BP 120/74 | Ht 67.0 in | Wt 166.0 lb

## 2014-12-27 DIAGNOSIS — E039 Hypothyroidism, unspecified: Secondary | ICD-10-CM | POA: Diagnosis not present

## 2014-12-27 DIAGNOSIS — Z01419 Encounter for gynecological examination (general) (routine) without abnormal findings: Secondary | ICD-10-CM | POA: Diagnosis not present

## 2014-12-27 LAB — CBC WITH DIFFERENTIAL/PLATELET
BASOS PCT: 0 % (ref 0–1)
Basophils Absolute: 0 10*3/uL (ref 0.0–0.1)
EOS PCT: 2 % (ref 0–5)
Eosinophils Absolute: 0.1 10*3/uL (ref 0.0–0.7)
HEMATOCRIT: 39.7 % (ref 36.0–46.0)
Hemoglobin: 13.4 g/dL (ref 12.0–15.0)
LYMPHS ABS: 2.8 10*3/uL (ref 0.7–4.0)
LYMPHS PCT: 43 % (ref 12–46)
MCH: 31.1 pg (ref 26.0–34.0)
MCHC: 33.8 g/dL (ref 30.0–36.0)
MCV: 92.1 fL (ref 78.0–100.0)
MONOS PCT: 7 % (ref 3–12)
MPV: 8.7 fL (ref 8.6–12.4)
Monocytes Absolute: 0.5 10*3/uL (ref 0.1–1.0)
NEUTROS PCT: 48 % (ref 43–77)
Neutro Abs: 3.1 10*3/uL (ref 1.7–7.7)
PLATELETS: 339 10*3/uL (ref 150–400)
RBC: 4.31 MIL/uL (ref 3.87–5.11)
RDW: 12.6 % (ref 11.5–15.5)
WBC: 6.5 10*3/uL (ref 4.0–10.5)

## 2014-12-27 LAB — TSH: TSH: 8.017 u[IU]/mL — ABNORMAL HIGH (ref 0.350–4.500)

## 2014-12-27 MED ORDER — LEVOTHYROXINE SODIUM 112 MCG PO TABS: ORAL_TABLET | ORAL | Status: DC

## 2014-12-27 MED ORDER — DROSPIRENONE-ETHINYL ESTRADIOL 3-0.02 MG PO TABS: ORAL_TABLET | ORAL | Status: DC

## 2014-12-27 NOTE — Patient Instructions (Signed)
You may obtain a copy of any labs that were done today by logging onto MyChart as outlined in the instructions provided with your AVS (after visit summary). The office will not call with normal lab results but certainly if there are any significant abnormalities then we will contact you.   Health Maintenance, Female A healthy lifestyle and preventative care can promote health and wellness.  Maintain regular health, dental, and eye exams.  Eat a healthy diet. Foods like vegetables, fruits, whole grains, low-fat dairy products, and lean protein foods contain the nutrients you need without too many calories. Decrease your intake of foods high in solid fats, added sugars, and salt. Get information about a proper diet from your caregiver, if necessary.  Regular physical exercise is one of the most important things you can do for your health. Most adults should get at least 150 minutes of moderate-intensity exercise (any activity that increases your heart rate and causes you to sweat) each week. In addition, most adults need muscle-strengthening exercises on 2 or more days a week.   Maintain a healthy weight. The body mass index (BMI) is a screening tool to identify possible weight problems. It provides an estimate of body fat based on height and weight. Your caregiver can help determine your BMI, and can help you achieve or maintain a healthy weight. For adults 20 years and older:  A BMI below 18.5 is considered underweight.  A BMI of 18.5 to 24.9 is normal.  A BMI of 25 to 29.9 is considered overweight.  A BMI of 30 and above is considered obese.  Maintain normal blood lipids and cholesterol by exercising and minimizing your intake of saturated fat. Eat a balanced diet with plenty of fruits and vegetables. Blood tests for lipids and cholesterol should begin at age 61 and be repeated every 5 years. If your lipid or cholesterol levels are high, you are over 50, or you are a high risk for heart  disease, you may need your cholesterol levels checked more frequently.Ongoing high lipid and cholesterol levels should be treated with medicines if diet and exercise are not effective.  If you smoke, find out from your caregiver how to quit. If you do not use tobacco, do not start.  Lung cancer screening is recommended for adults aged 33 80 years who are at high risk for developing lung cancer because of a history of smoking. Yearly low-dose computed tomography (CT) is recommended for people who have at least a 30-pack-year history of smoking and are a current smoker or have quit within the past 15 years. A pack year of smoking is smoking an average of 1 pack of cigarettes a day for 1 year (for example: 1 pack a day for 30 years or 2 packs a day for 15 years). Yearly screening should continue until the smoker has stopped smoking for at least 15 years. Yearly screening should also be stopped for people who develop a health problem that would prevent them from having lung cancer treatment.  If you are pregnant, do not drink alcohol. If you are breastfeeding, be very cautious about drinking alcohol. If you are not pregnant and choose to drink alcohol, do not exceed 1 drink per day. One drink is considered to be 12 ounces (355 mL) of beer, 5 ounces (148 mL) of wine, or 1.5 ounces (44 mL) of liquor.  Avoid use of street drugs. Do not share needles with anyone. Ask for help if you need support or instructions about stopping  the use of drugs.  High blood pressure causes heart disease and increases the risk of stroke. Blood pressure should be checked at least every 1 to 2 years. Ongoing high blood pressure should be treated with medicines, if weight loss and exercise are not effective.  If you are 59 to 47 years old, ask your caregiver if you should take aspirin to prevent strokes.  Diabetes screening involves taking a blood sample to check your fasting blood sugar level. This should be done once every 3  years, after age 91, if you are within normal weight and without risk factors for diabetes. Testing should be considered at a younger age or be carried out more frequently if you are overweight and have at least 1 risk factor for diabetes.  Breast cancer screening is essential preventative care for women. You should practice "breast self-awareness." This means understanding the normal appearance and feel of your breasts and may include breast self-examination. Any changes detected, no matter how small, should be reported to a caregiver. Women in their 66s and 30s should have a clinical breast exam (CBE) by a caregiver as part of a regular health exam every 1 to 3 years. After age 101, women should have a CBE every year. Starting at age 100, women should consider having a mammogram (breast X-ray) every year. Women who have a family history of breast cancer should talk to their caregiver about genetic screening. Women at a high risk of breast cancer should talk to their caregiver about having an MRI and a mammogram every year.  Breast cancer gene (BRCA)-related cancer risk assessment is recommended for women who have family members with BRCA-related cancers. BRCA-related cancers include breast, ovarian, tubal, and peritoneal cancers. Having family members with these cancers may be associated with an increased risk for harmful changes (mutations) in the breast cancer genes BRCA1 and BRCA2. Results of the assessment will determine the need for genetic counseling and BRCA1 and BRCA2 testing.  The Pap test is a screening test for cervical cancer. Women should have a Pap test starting at age 57. Between ages 25 and 35, Pap tests should be repeated every 2 years. Beginning at age 37, you should have a Pap test every 3 years as long as the past 3 Pap tests have been normal. If you had a hysterectomy for a problem that was not cancer or a condition that could lead to cancer, then you no longer need Pap tests. If you are  between ages 50 and 76, and you have had normal Pap tests going back 10 years, you no longer need Pap tests. If you have had past treatment for cervical cancer or a condition that could lead to cancer, you need Pap tests and screening for cancer for at least 20 years after your treatment. If Pap tests have been discontinued, risk factors (such as a new sexual partner) need to be reassessed to determine if screening should be resumed. Some women have medical problems that increase the chance of getting cervical cancer. In these cases, your caregiver may recommend more frequent screening and Pap tests.  The human papillomavirus (HPV) test is an additional test that may be used for cervical cancer screening. The HPV test looks for the virus that can cause the cell changes on the cervix. The cells collected during the Pap test can be tested for HPV. The HPV test could be used to screen women aged 44 years and older, and should be used in women of any age  who have unclear Pap test results. After the age of 38, women should have HPV testing at the same frequency as a Pap test.  Colorectal cancer can be detected and often prevented. Most routine colorectal cancer screening begins at the age of 23 and continues through age 25. However, your caregiver may recommend screening at an earlier age if you have risk factors for colon cancer. On a yearly basis, your caregiver may provide home test kits to check for hidden blood in the stool. Use of a small camera at the end of a tube, to directly examine the colon (sigmoidoscopy or colonoscopy), can detect the earliest forms of colorectal cancer. Talk to your caregiver about this at age 37, when routine screening begins. Direct examination of the colon should be repeated every 5 to 10 years through age 90, unless early forms of pre-cancerous polyps or small growths are found.  Hepatitis C blood testing is recommended for all people born from [birth year] through 1963 and any  individual with known risks for hepatitis C.  Practice safe sex. Use condoms and avoid high-risk sexual practices to reduce the spread of sexually transmitted infections (STIs). Sexually active women aged 29 and younger should be checked for Chlamydia, which is a common sexually transmitted infection. Older women with new or multiple partners should also be tested for Chlamydia. Testing for other STIs is recommended if you are sexually active and at increased risk.  Osteoporosis is a disease in which the bones lose minerals and strength with aging. This can result in serious bone fractures. The risk of osteoporosis can be identified using a bone density scan. Women ages 12 and over and women at risk for fractures or osteoporosis should discuss screening with their caregivers. Ask your caregiver whether you should be taking a calcium supplement or vitamin D to reduce the rate of osteoporosis.  Menopause can be associated with physical symptoms and risks. Hormone replacement therapy is available to decrease symptoms and risks. You should talk to your caregiver about whether hormone replacement therapy is right for you.  Use sunscreen. Apply sunscreen liberally and repeatedly throughout the day. You should seek shade when your shadow is shorter than you. Protect yourself by wearing long sleeves, pants, a wide-brimmed hat, and sunglasses year round, whenever you are outdoors.  Notify your caregiver of new moles or changes in moles, especially if there is a change in shape or color. Also notify your caregiver if a mole is larger than the size of a pencil eraser.  Stay current with your immunizations. Document Released: 04/21/2011 Document Revised: 01/31/2013 Document Reviewed: 04/21/2011 [facility] Patient Information 2014 [place].

## 2014-12-27 NOTE — Progress Notes (Signed)
Linda Berger 12/16/1965 [id number]        49 y.o.  G0P0 for annual exam.  Several issues noted below.  Past medical history,surgical history, problem list, medications, allergies, family history and social history were all reviewed and documented as reviewed in the EPIC chart.  ROS:  Performed with pertinent positives and negatives included in the history, assessment and plan.   Additional significant findings :  none   Exam: [facility] personassistant Filed Vitals:   12/27/14 1600  BP: 120/74  Height: 5\' 7"  (1.702 m)  Weight: 166 lb (75.297 kg)   General appearance:  Normal affect, orientation and appearance. Skin: Grossly normal HEENT: Without gross lesions.  No cervical or supraclavicular adenopathy. Thyroid normal.  Lungs:  Clear without wheezing, rales or rhonchi Cardiac: RR, without RMG Abdominal:  Soft, nontender, without masses, guarding, rebound, organomegaly or hernia Breasts:  Examined lying and sitting without masses, retractions, discharge or axillary adenopathy. Pelvic:  Ext/BUS/vagina normal  Cervix normal  Uterus anteverted, normal size, shape and contour, midline and mobile nontender   Adnexa  Without masses or tenderness    Anus and perineum  Normal   Rectovaginal  Normal sphincter tone without palpated masses or tenderness.    Assessment/Plan:  49 y.o. G0P0 female for annual exam without menses, continuous oral contraceptives, vasectomy.   1. Continuous oral contraceptives.  Patient continues on Yaz BCP's equivalent. Using this for menstrual suppression/PMDD suppression.  Also husband with vasectomy. We discussed on multiple occasions the risks to include stroke heart attack DVT and possible increased risk with drospirenone. Past history of smoking quit 2001. The patient's comfortable with continuing accepting the risks and I refilled her 1 year. 2. Hypothyroid. On Synthroid 112 g daily. Check TSH. Refill 1 year. 3. Pap smear/HPV negative 2015. No Pap smear done today.   History of cervical dysplasia in 1999. 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 Normal Pap smears since then. Plan repeat Pap smear at 3-5 year interval per current screening guidelines. 4. Mammography 02/2014. Repeat this coming May. SBE monthly reviewed. 5. Health maintenance. Baseline CBC and TSH urinalysis ordered.  Reports normal glucose and lipid profile at work. Follow up in one year, sooner as needed     Dara LordsFONTAINE,Eion Timbrook P MD, 4:24 PM 12/27/2014

## 2014-12-28 ENCOUNTER — Other Ambulatory Visit: Payer: Self-pay

## 2014-12-28 ENCOUNTER — Other Ambulatory Visit: Payer: Self-pay | Admitting: Gynecology

## 2014-12-28 DIAGNOSIS — R7989 Other specified abnormal findings of blood chemistry: Secondary | ICD-10-CM

## 2014-12-28 LAB — URINALYSIS W MICROSCOPIC + REFLEX CULTURE
BACTERIA UA: NONE SEEN
BILIRUBIN URINE: NEGATIVE
CASTS: NONE SEEN
CRYSTALS: NONE SEEN
GLUCOSE, UA: NEGATIVE mg/dL
HGB URINE DIPSTICK: NEGATIVE
Ketones, ur: NEGATIVE mg/dL
Leukocytes, UA: NEGATIVE
Nitrite: NEGATIVE
PH: 7.5 (ref 5.0–8.0)
Protein, ur: NEGATIVE mg/dL
SPECIFIC GRAVITY, URINE: 1.015 (ref 1.005–1.030)
Squamous Epithelial / LPF: NONE SEEN
UROBILINOGEN UA: 1 mg/dL (ref 0.0–1.0)

## 2014-12-28 MED ORDER — LEVOTHYROXINE SODIUM 112 MCG PO TABS: ORAL_TABLET | ORAL | Status: DC

## 2014-12-28 MED ORDER — DROSPIRENONE-ETHINYL ESTRADIOL 3-0.02 MG PO TABS: ORAL_TABLET | ORAL | Status: DC

## 2014-12-28 MED ORDER — LEVOTHYROXINE SODIUM 125 MCG PO TABS
125.0000 ug | ORAL_TABLET | Freq: Every day | ORAL | Status: DC
Start: 2014-12-28 — End: 2015-04-02

## 2015-01-01 ENCOUNTER — Other Ambulatory Visit: Payer: Self-pay

## 2015-01-02 NOTE — Telephone Encounter (Signed)
Not needed

## 2015-01-09 ENCOUNTER — Other Ambulatory Visit: Payer: Self-pay | Admitting: Gynecology

## 2015-02-20 ENCOUNTER — Other Ambulatory Visit: Payer: Self-pay

## 2015-02-20 DIAGNOSIS — Z1231 Encounter for screening mammogram for malignant neoplasm of breast: Secondary | ICD-10-CM

## 2015-03-05 ENCOUNTER — Ambulatory Visit
Admission: RE | Admit: 2015-03-05 | Discharge: 2015-03-05 | Disposition: A | Discharge status: Home / Self Care | Payer: Managed Care, Other (non HMO) | Source: Ambulatory Visit

## 2015-03-05 DIAGNOSIS — Z1231 Encounter for screening mammogram for malignant neoplasm of breast: Secondary | ICD-10-CM

## 2015-03-30 ENCOUNTER — Other Ambulatory Visit: Payer: Managed Care, Other (non HMO)

## 2015-03-30 DIAGNOSIS — R7989 Other specified abnormal findings of blood chemistry: Secondary | ICD-10-CM

## 2015-03-31 LAB — TSH: TSH: 3.327 u[IU]/mL (ref 0.350–4.500)

## 2015-04-02 ENCOUNTER — Other Ambulatory Visit: Payer: Self-pay | Admitting: *Deleted

## 2015-04-02 MED ORDER — LEVOTHYROXINE SODIUM 112 MCG PO TABS: ORAL_TABLET | ORAL | Status: DC

## 2015-04-02 MED ORDER — LEVOTHYROXINE SODIUM 125 MCG PO TABS: 125.0000 ug | ORAL_TABLET | Freq: Every day | ORAL | Status: DC

## 2015-04-02 NOTE — Addendum Note (Signed)
Addended by: Aura Camps on: 04/02/2015 01:44 PM   Modules accepted: Orders, Medications

## 2015-04-06 ENCOUNTER — Other Ambulatory Visit: Payer: Self-pay | Admitting: Gynecology

## 2015-04-06 ENCOUNTER — Other Ambulatory Visit: Payer: Self-pay

## 2015-04-06 MED ORDER — LEVOTHYROXINE SODIUM 125 MCG PO TABS: 125.0000 ug | ORAL_TABLET | Freq: Every day | ORAL | Status: DC

## 2015-10-29 ENCOUNTER — Other Ambulatory Visit: Payer: Self-pay

## 2015-10-29 MED ORDER — LEVOTHYROXINE SODIUM 125 MCG PO TABS: 125.0000 ug | ORAL_TABLET | Freq: Every day | ORAL | Status: DC

## 2015-10-30 ENCOUNTER — Other Ambulatory Visit: Payer: Self-pay

## 2015-12-25 ENCOUNTER — Ambulatory Visit (INDEPENDENT_AMBULATORY_CARE_PROVIDER_SITE_OTHER): Payer: Managed Care, Other (non HMO) | Admitting: Internal Medicine

## 2015-12-25 ENCOUNTER — Encounter: Payer: Self-pay | Admitting: Internal Medicine

## 2015-12-25 ENCOUNTER — Other Ambulatory Visit (INDEPENDENT_AMBULATORY_CARE_PROVIDER_SITE_OTHER): Payer: Managed Care, Other (non HMO)

## 2015-12-25 VITALS — BP 120/78 | HR 71 | Temp 98.4°F | Resp 14 | Ht 67.0 in | Wt 183.8 lb

## 2015-12-25 DIAGNOSIS — E663 Overweight: Secondary | ICD-10-CM

## 2015-12-25 DIAGNOSIS — E039 Hypothyroidism, unspecified: Secondary | ICD-10-CM

## 2015-12-25 DIAGNOSIS — E669 Obesity, unspecified: Secondary | ICD-10-CM | POA: Insufficient documentation

## 2015-12-25 LAB — T4, FREE: FREE T4: 1.07 ng/dL (ref 0.60–1.60)

## 2015-12-25 LAB — TSH: TSH: 0.45 u[IU]/mL (ref 0.35–4.50)

## 2015-12-25 MED ORDER — NALTREXONE-BUPROPION HCL ER 8-90 MG PO TB12: ORAL_TABLET | ORAL | Status: DC

## 2015-12-25 NOTE — Assessment & Plan Note (Signed)
Rx for contrave for her cravings. Given rx and coupon today with instructions on titration and use.

## 2015-12-25 NOTE — Assessment & Plan Note (Signed)
Checking TSH and free T4 today and adjust as needed. Currently taking synthroid 125 mcg daily.

## 2015-12-25 NOTE — Progress Notes (Signed)
Pre visit review using our clinic review tool, if applicable. No additional management support is needed unless otherwise documented below in the visit note. 

## 2015-12-25 NOTE — Progress Notes (Signed)
   Subjective:    Patient ID: Linda Berger, female    DOB: 07/31/1966, 50 y.o.   MRN: [id number]  HPI The patient is a new 50 YO female coming in for weight loss. She has heard about contrave and wants to see if she can try it. She has problems with cravings since she is on OCP for her PMDD. She has problems with her weight going up and down. She is not at her largest now but is not happy with her weight. Not exercising as much as she should. It is hard in the winter when it is dark more of the time.   [facility], social history reviewed and updated.   Review of Systems  Constitutional: Negative for fever, activity change, appetite change, fatigue and unexpected weight change.  HENT: Negative.   Eyes: Negative.   Respiratory: Negative for cough, chest tightness, shortness of breath and wheezing.   Cardiovascular: Negative for chest pain, palpitations and leg swelling.  Gastrointestinal: Negative for nausea, abdominal pain, diarrhea, constipation and abdominal distention.  Musculoskeletal: Negative.   Skin: Negative.   Neurological: Negative.   Psychiatric/Behavioral: Negative.       Objective:   Physical Exam  Constitutional: She is oriented to person, place, and time. She appears well-developed and well-nourished.  Overweight  HENT:  Head: Normocephalic and atraumatic.  Eyes: EOM are normal.  Neck: Normal range of motion.  Cardiovascular: Normal rate and regular rhythm.   No murmur heard. Pulmonary/Chest: Effort normal and breath sounds normal. No respiratory distress. She has no wheezes. She has no rales.  Abdominal: Soft. Bowel sounds are normal. She exhibits no distension. There is no tenderness. There is no rebound.  Musculoskeletal: She exhibits no edema.  Neurological: She is alert and oriented to person, place, and time.  Skin: Skin is warm and dry.  Psychiatric: She has a normal mood and affect.   Filed Vitals:   12/25/15 1402  BP: 120/78  Pulse: 71  Temp: 98.4 F  (36.9 C)  TempSrc: Oral  Resp: 14  Height: 5\' 7"  (1.702 m)  Weight: 183 lb 12.8 oz (83.371 kg)  SpO2: 98%      Assessment & Plan:

## 2015-12-25 NOTE — Patient Instructions (Signed)
For the contrave we have given you the card and here are the instructions. Week 1: 1 pill daily. Week 2: 1 pill twice a day. Week 3: 2 pills in morning 1 pill at night. Week 4 onward: 2 pills twice a day.  We are checking the labs for the thyroid today, we will see you back in about 3 months to see how you are doing with the contrave.   Exercising to Lose Weight Exercising can help you to lose weight. In order to lose weight through exercise, you need to do vigorous-intensity exercise. You can tell that you are exercising with vigorous intensity if you are breathing very hard and fast and cannot hold a conversation while exercising. Moderate-intensity exercise helps to maintain your current weight. You can tell that you are exercising at a moderate level if you have a higher heart rate and faster breathing, but you are still able to hold a conversation. HOW OFTEN SHOULD I EXERCISE? Choose an activity that you enjoy and set realistic goals. Your health care provider can help you to make an activity plan that works for you. Exercise regularly as directed by your health care provider. This may include:  Doing resistance training twice each week, such as:  Push-ups.  Sit-ups.  Lifting weights.  Using resistance bands.  Doing a given intensity of exercise for a given amount of time. Choose from these options:  150 minutes of moderate-intensity exercise every week.  75 minutes of vigorous-intensity exercise every week.  A mix of moderate-intensity and vigorous-intensity exercise every week. Children, pregnant women, people who are out of shape, people who are overweight, and older adults may need to consult a health care provider for individual recommendations. If you have any sort of medical condition, be sure to consult your health care provider before starting a new exercise program. WHAT ARE SOME ACTIVITIES THAT CAN HELP ME TO LOSE WEIGHT?   Walking at a rate of at least 4.5 miles an  hour.  Jogging or running at a rate of 5 miles per hour.  Biking at a rate of at least 10 miles per hour.  Lap swimming.  Roller-skating or in-line skating.  Cross-country skiing.  Vigorous competitive sports, such as football, basketball, and soccer.  Jumping rope.  Aerobic dancing. HOW CAN I BE MORE ACTIVE IN MY DAY-TO-DAY ACTIVITIES?  Use the stairs instead of the elevator.  Take a walk during your lunch break.  If you drive, park your car farther away from work or school.  If you take public transportation, get off one stop early and walk the rest of the way.  Make all of your phone calls while standing up and walking around.  Get up, stretch, and walk around every 30 minutes throughout the day. WHAT GUIDELINES SHOULD I FOLLOW WHILE EXERCISING?  Do not exercise so much that you hurt yourself, feel dizzy, or get very short of breath.  Consult your health care provider prior to starting a new exercise program.  Wear comfortable clothes and shoes with good support.  Drink plenty of water while you exercise to prevent dehydration or heat stroke. Body water is lost during exercise and must be replaced.  Work out until you breathe faster and your heart beats faster.   This information is not intended to replace advice given to you by your health care provider. Make sure you discuss any questions you have with your health care provider.   Document Released: 11/08/2010 Document Revised: 10/27/2014 Document Reviewed: 03/09/2014  Chartered certified accountant Patient Education [employer].

## 2016-01-03 ENCOUNTER — Encounter: Payer: Managed Care, Other (non HMO) | Admitting: Gynecology

## 2016-01-03 ENCOUNTER — Other Ambulatory Visit: Payer: Self-pay | Admitting: Gynecology

## 2016-01-03 MED ORDER — LEVOTHYROXINE SODIUM 125 MCG PO TABS: 125.0000 ug | ORAL_TABLET | Freq: Every day | ORAL | Status: DC

## 2016-01-03 NOTE — Addendum Note (Signed)
Addended by: Keenan BachelorANNAS, Kyarah Enamorado R on: 01/03/2016 11:33 AM   Modules accepted: Orders

## 2016-01-24 ENCOUNTER — Ambulatory Visit (INDEPENDENT_AMBULATORY_CARE_PROVIDER_SITE_OTHER): Payer: Managed Care, Other (non HMO) | Admitting: Gynecology

## 2016-01-24 ENCOUNTER — Encounter: Payer: Self-pay | Admitting: Gynecology

## 2016-01-24 VITALS — BP 134/80 | Ht 67.0 in | Wt 182.0 lb

## 2016-01-24 DIAGNOSIS — Z01419 Encounter for gynecological examination (general) (routine) without abnormal findings: Secondary | ICD-10-CM

## 2016-01-24 DIAGNOSIS — F3281 Premenstrual dysphoric disorder: Secondary | ICD-10-CM | POA: Diagnosis not present

## 2016-01-24 MED ORDER — DROSPIRENONE-ETHINYL ESTRADIOL 3-0.02 MG PO TABS: ORAL_TABLET | ORAL | Status: DC

## 2016-01-24 NOTE — Progress Notes (Signed)
    Linda Berger 10/21/1965 [id number]        50 y.o.  G0P0  for annual exam.  Doing well. Several issues noted below.  Past medical history,surgical history, problem list, medications, allergies, family history and social history were all reviewed and documented as reviewed in the EPIC chart.  ROS:  Performed with pertinent positives and negatives included in the history, assessment and plan.   Additional significant findings :  none   Exam: Kennon PortelaKim Gardner assistant Filed Vitals:   01/24/16 1558  BP: 134/80  Height: 5\' 7"  (1.702 m)  Weight: 182 lb (82.555 kg)   General appearance:  Normal affect, orientation and appearance. Skin: Grossly normal HEENT: Without gross lesions.  No cervical or supraclavicular adenopathy. Thyroid normal.  Lungs:  Clear without wheezing, rales or rhonchi Cardiac: RR, without RMG Abdominal:  Soft, nontender, without masses, guarding, rebound, organomegaly or hernia Breasts:  Examined lying and sitting without masses, retractions, discharge or axillary adenopathy. Pelvic:  Ext/BUS/vagina normal  Cervix normal  Uterus anteverted, normal size, shape and contour, midline and mobile nontender   Adnexa without masses or tenderness    Anus and perineum normal   Rectovaginal normal sphincter tone without palpated masses or tenderness.    Assessment/Plan:  [age] y.o. G0P0 female for annual exam without menses, continuous oral contraceptives.   1. Continuous oral contraceptives. Patient continues on Yaz equivalent BCPs. She's doing this for menstrual suppression and more significantly for PMDD suppression. Has a history of significant PMDD before starting the pills with good suppression. I again reviewed the whole issue of oral contraceptive, increasing age, past history of cigarette smoking having quit in 2001 and increased risk of thrombosis associated with drospirenone. Stroke heart attack DVT risks reviewed. Options to switch to a different birth control pill,  trial of IUD although not sure this would help with her PMDD, stopping the pills altogether and using alternative treatments for PMDD such as fluoxetine all reviewed. Patient would very much prefer to continue as is clearly understanding the risks. Refill 1 year provided. 2. Mammography coming due in May and I reminded her to schedule this. SBE monthly reviewed. 3. Pap smear/HPV 12/2013 negative. No Pap smear done today. History of dysplasia in 1999. 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 Normal Pap smears since. 4. Health maintenance. No routine lab work done as patient reports having established primary care with another physician who is doing all of her lab work and follow up on her hypothyroidism. Mildly elevated blood pressure at 134/80 noted. Patient relates having it checked at work and is always normal.  Follow up in one year, sooner as needed.   Dara LordsFONTAINE,TIMOTHY P MD, 4:23 PM 01/24/2016

## 2016-01-24 NOTE — Patient Instructions (Signed)

## 2016-01-31 ENCOUNTER — Other Ambulatory Visit: Payer: Self-pay

## 2016-01-31 DIAGNOSIS — Z1231 Encounter for screening mammogram for malignant neoplasm of breast: Secondary | ICD-10-CM

## 2016-03-18 ENCOUNTER — Ambulatory Visit
Admission: RE | Admit: 2016-03-18 | Discharge: 2016-03-18 | Disposition: A | Discharge status: Home / Self Care | Payer: Managed Care, Other (non HMO) | Source: Ambulatory Visit

## 2016-03-18 DIAGNOSIS — Z1231 Encounter for screening mammogram for malignant neoplasm of breast: Secondary | ICD-10-CM

## 2016-05-27 ENCOUNTER — Encounter: Payer: Self-pay | Admitting: Internal Medicine

## 2016-05-27 ENCOUNTER — Ambulatory Visit (INDEPENDENT_AMBULATORY_CARE_PROVIDER_SITE_OTHER): Payer: Managed Care, Other (non HMO) | Admitting: Internal Medicine

## 2016-05-27 DIAGNOSIS — F3281 Premenstrual dysphoric disorder: Secondary | ICD-10-CM

## 2016-05-27 MED ORDER — ESCITALOPRAM OXALATE 10 MG PO TABS: 10.0000 mg | ORAL_TABLET | Freq: Every day | ORAL | 3 refills | Status: DC

## 2016-05-27 NOTE — Patient Instructions (Signed)
We have sent in lexapro to the pharmacy to help with the hormone imbalance. This can take 4-5 weeks for it to work fully.   We want you to call us or send a mychart message in about 3-4 weeks to let us know how you are doing.   Work on doing some exercise to help with anxiety and negative hormones. Also consider starting some self-love activities with things you like to do or that are relaxing.   Stress and Stress Management Stress is a normal reaction to life events. It is what you feel when life demands more than you are used to or more than you can handle. Some stress can be useful. For example, the stress reaction can help you catch the last bus of the day, study for a test, or meet a deadline at work. But stress that occurs too often or for too long can cause problems. It can affect your emotional health and interfere with relationships and normal daily activities. Too much stress can weaken your immune system and increase your risk for physical illness. If you already have a medical problem, stress can make it worse. CAUSES  All sorts of life events may cause stress. An event that causes stress for one person may not be stressful for another person. Major life events commonly cause stress. These may be positive or negative. Examples include losing your job, moving into a new home, getting married, having a baby, or losing a loved one. Less obvious life events may also cause stress, especially if they occur day after day or in combination. Examples include working long hours, driving in traffic, caring for children, being in debt, or being in a difficult relationship. SIGNS AND SYMPTOMS Stress may cause emotional symptoms including, the following:  Anxiety. This is feeling worried, afraid, on edge, overwhelmed, or out of control.  Anger. This is feeling irritated or impatient.  Depression. This is feeling sad, down, helpless, or guilty.  Difficulty focusing, remembering, or making  decisions. Stress may cause physical symptoms, including the following:   Aches and pains. These may affect your head, neck, back, stomach, or other areas of your body.  Tight muscles or clenched jaw.  Low energy or trouble sleeping. Stress may cause unhealthy behaviors, including the following:   Eating to feel better (overeating) or skipping meals.  Sleeping too little, too much, or both.  Working too much or putting off tasks (procrastination).  Smoking, drinking alcohol, or using drugs to feel better. DIAGNOSIS  Stress is diagnosed through an assessment by your health care provider. Your health care provider will ask questions about your symptoms and any stressful life events.Your health care provider will also ask about your medical history and may order blood tests or other tests. Certain medical conditions and medicine can cause physical symptoms similar to stress. Mental illness can cause emotional symptoms and unhealthy behaviors similar to stress. Your health care provider may refer you to a mental health professional for further evaluation.  TREATMENT  Stress management is the recommended treatment for stress.The goals of stress management are reducing stressful life events and coping with stress in healthy ways.  Techniques for reducing stressful life events include the following:  Stress identification. Self-monitor for stress and identify what causes stress for you. These skills may help you to avoid some stressful events.  Time management. Set your priorities, keep a calendar of events, and learn to say "no." These tools can help you avoid making too many commitments. Techniques for  coping with stress include the following:  Rethinking the problem. Try to think realistically about stressful events rather than ignoring them or overreacting. Try to find the positives in a stressful situation rather than focusing on the negatives.  Exercise. Physical exercise can release  both physical and emotional tension. The key is to find a form of exercise you enjoy and do it regularly.  Relaxation techniques. These relax the body and mind. Examples include yoga, meditation, tai chi, biofeedback, deep breathing, progressive muscle relaxation, listening to music, being out in nature, journaling, and other hobbies. Again, the key is to find one or more that you enjoy and can do regularly.  Healthy lifestyle. Eat a balanced diet, get plenty of sleep, and do not smoke. Avoid using alcohol or drugs to relax.  Strong support network. Spend time with family, friends, or other people you enjoy being around.Express your feelings and talk things over with someone you trust. Counseling or talktherapy with a mental health professional may be helpful if you are having difficulty managing stress on your own. Medicine is typically not recommended for the treatment of stress.Talk to your health care provider if you think you need medicine for symptoms of stress. HOME CARE INSTRUCTIONS  Keep all follow-up visits as directed by your health care provider.  Take all medicines as directed by your health care provider. SEEK MEDICAL CARE IF:  Your symptoms get worse or you start having new symptoms.  You feel overwhelmed by your problems and can no longer manage them on your own. SEEK IMMEDIATE MEDICAL CARE IF:  You feel like hurting yourself or someone else.   This information is not intended to replace advice given to you by your health care provider. Make sure you discuss any questions you have with your health care provider.   Document Released: 04/01/2001 Document Revised: 10/27/2014 Document Reviewed: 05/31/2013 Elsevier Interactive Patient Education [employer].

## 2016-05-27 NOTE — Assessment & Plan Note (Signed)
Becoming severe at this time. Has previously failed wellbutrin. Will rx lexapro to help with symptoms. She is currently on birth control pills and it is possible that her perimenopausal symptoms could be expressing like this. Encouraged her to start some self-care activities and start exercising to help burn off some stress and anxiety. She will let us know how she is doing in about 3-4 weeks.

## 2016-05-27 NOTE — Progress Notes (Signed)
Pre visit review using our clinic review tool, if applicable. No additional management support is needed unless otherwise documented below in the visit note. 

## 2016-05-27 NOTE — Progress Notes (Signed)
   Subjective:    Patient ID: Linda Berger, female    DOB: 07/28/1966, 50 y.o.   MRN: [id number]  HPI The patient is a 50 YO female coming in for depression. She has been going through some increasing stress at home in the last several months. Denies SI/HI. She did not take the contrave since last time due to cost. Started about 6 months ago. She has always had PMDD and it feels like that but more of the time. She was warned by an ob/gyn that this pmdd would worsen as she approaches menopause due to fluctuating hormone levels. She is crying for no reason and frazzled by any change in plans. She is not even happy being around herself. She is not doing any stress reducing activities or exercise lately.   Review of Systems  Constitutional: Negative for activity change, appetite change, fatigue, fever and unexpected weight change.  Respiratory: Negative for cough, chest tightness, shortness of breath and wheezing.   Cardiovascular: Negative for chest pain, palpitations and leg swelling.  Gastrointestinal: Negative for abdominal distention, abdominal pain, constipation, diarrhea and nausea.  Neurological: Negative.   Psychiatric/Behavioral: Positive for decreased concentration and dysphoric mood. Negative for behavioral problems, confusion, self-injury, sleep disturbance and suicidal ideas. The patient is nervous/anxious.       Objective:   Physical Exam  Constitutional: She is oriented to person, place, and time. She appears well-developed and well-nourished.  Overweight  HENT:  Head: Normocephalic and atraumatic.  Eyes: EOM are normal.  Neck: Normal range of motion.  Cardiovascular: Normal rate and regular rhythm.   No murmur heard. Pulmonary/Chest: Effort normal and breath sounds normal. No respiratory distress. She has no wheezes. She has no rales.  Abdominal: Soft.  Musculoskeletal: She exhibits no edema.  Neurological: She is alert and oriented to person, place, and time.  Psychiatric:   Tearful during the visit and dysphoric mood   Vitals:   05/27/16 1400  BP: 114/84  Pulse: 67  Resp: 14  Temp: 98.6 F (37 C)  TempSrc: Oral  SpO2: 98%  Height: 5\' 8"  (1.727 m)      Assessment & Plan:

## 2016-06-20 ENCOUNTER — Encounter: Payer: Self-pay | Admitting: Internal Medicine

## 2016-09-14 ENCOUNTER — Other Ambulatory Visit: Payer: Self-pay | Admitting: Internal Medicine

## 2016-12-26 ENCOUNTER — Other Ambulatory Visit: Payer: Self-pay | Admitting: *Deleted

## 2016-12-26 MED ORDER — ESCITALOPRAM OXALATE 10 MG PO TABS
10.0000 mg | ORAL_TABLET | Freq: Every day | ORAL | 0 refills | Status: DC
Start: 2016-12-26 — End: 2017-01-26

## 2017-01-21 ENCOUNTER — Other Ambulatory Visit: Payer: Self-pay | Admitting: Internal Medicine

## 2017-01-21 NOTE — Telephone Encounter (Signed)
Patient received 30 days last month and was noted that she is overdue for yearly physical. Please advise

## 2017-01-24 ENCOUNTER — Other Ambulatory Visit: Payer: Self-pay | Admitting: Gynecology

## 2017-01-26 ENCOUNTER — Encounter: Payer: Self-pay | Admitting: Gynecology

## 2017-01-26 ENCOUNTER — Ambulatory Visit (INDEPENDENT_AMBULATORY_CARE_PROVIDER_SITE_OTHER): Payer: Managed Care, Other (non HMO) | Admitting: Gynecology

## 2017-01-26 VITALS — BP 120/76 | Ht 67.0 in | Wt 198.0 lb

## 2017-01-26 DIAGNOSIS — Z01419 Encounter for gynecological examination (general) (routine) without abnormal findings: Secondary | ICD-10-CM | POA: Diagnosis not present

## 2017-01-26 DIAGNOSIS — E039 Hypothyroidism, unspecified: Secondary | ICD-10-CM | POA: Diagnosis not present

## 2017-01-26 LAB — TSH: TSH: 0.2 m[IU]/L — AB

## 2017-01-26 MED ORDER — LEVOTHYROXINE SODIUM 125 MCG PO TABS: 125.0000 ug | ORAL_TABLET | Freq: Every day | ORAL | 4 refills | Status: DC

## 2017-01-26 MED ORDER — ESCITALOPRAM OXALATE 10 MG PO TABS: 10.0000 mg | ORAL_TABLET | Freq: Every day | ORAL | 4 refills | Status: DC

## 2017-01-26 MED ORDER — DROSPIRENONE-ETHINYL ESTRADIOL 3-0.02 MG PO TABS: ORAL_TABLET | ORAL | 4 refills | Status: DC

## 2017-01-26 NOTE — Progress Notes (Signed)
    Linda Berger December 19, 1965 [id number]        51 y.o.  G0P0 for annual exam.   Past medical history,surgical history, problem list, medications, allergies, family history and social history were all reviewed and documented as reviewed in the EPIC chart.  ROS:  Performed with pertinent positives and negatives included in the history, assessment and plan.   Additional significant findings :  None   Exam: Kennon Portela assistant Vitals:   01/26/17 1527  BP: 120/76  Weight: 198 lb (89.8 kg)  Height:  (1.702 m)   Body mass index is 31.01 kg/m.  General appearance:  Normal affect, orientation and appearance. Skin: Grossly normal HEENT: Without gross lesions.  No cervical or supraclavicular adenopathy. Thyroid normal.  Lungs:  Clear without wheezing, rales or rhonchi Cardiac: RR, without RMG Abdominal:  Soft, nontender, without masses, guarding, rebound, organomegaly or hernia Breasts:  Examined lying and sitting without masses, retractions, discharge or axillary adenopathy. Pelvic:  Ext, BUS, Vagina: Normal  Cervix: Normal  Uterus: Anteverted, normal size, shape and contour, midline and mobile nontender   Adnexa: Without masses or tenderness    Anus and perineum: Normal   Rectovaginal: Normal sphincter tone without palpated masses or tenderness.    Assessment/Plan:  51 y.o. G0P0 female for annual exam without menses, continuous pills.   1. Continuous oral contraceptives. Continues on Yaz equivalent's daily for PMDD suppression. Has had a great response and wants to continue. I again reviewed the issue of oral contraceptives with advancing age, drospirenone and her past history of smoking years ago all increasing her risk of thrombosis. Stroke heart attack DVT all discussed. Patient is very reluctant in switching she was significantly impacted by her PMDD is doing so well now. She did have some slight increase in her symptoms this past year and was started on Lexapro 10 mg daily  by her other physician and has done well. After a thorough discussion and a realistic understanding of the risks the patient desires to continue I refilled her BCPs 1 year as well as her Lexapro. 2. Mammography 02/2016. Continue with annual mammography when due. SBE monthly reviewed. 3. Pap smear/HPV 2015 negative. No Pap smear done today. History of dysplasia 1999. 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 Normal Pap smears since. 4. Hypothyroid. On Synthroid 125 g daily. Check TSH today. Refill Synthroid 1 year. 5. Health maintenance. Patient reports routine lab work done elsewhere. Follow up in one year, sooner as needed.   Dara Lords MD, 4:00 PM 01/26/2017

## 2017-01-26 NOTE — Patient Instructions (Signed)

## 2017-01-27 ENCOUNTER — Other Ambulatory Visit: Payer: Self-pay | Admitting: Gynecology

## 2017-01-27 DIAGNOSIS — R7989 Other specified abnormal findings of blood chemistry: Secondary | ICD-10-CM

## 2017-02-16 ENCOUNTER — Other Ambulatory Visit: Payer: Managed Care, Other (non HMO)

## 2017-02-16 DIAGNOSIS — R7989 Other specified abnormal findings of blood chemistry: Secondary | ICD-10-CM

## 2017-02-17 LAB — TSH: TSH: 0.25 m[IU]/L — AB

## 2017-02-19 ENCOUNTER — Other Ambulatory Visit: Payer: Self-pay | Admitting: Gynecology

## 2017-02-19 MED ORDER — LEVOTHYROXINE SODIUM 112 MCG PO TABS: 112.0000 ug | ORAL_TABLET | Freq: Every day | ORAL | 0 refills | Status: DC

## 2017-03-11 ENCOUNTER — Other Ambulatory Visit: Payer: Self-pay | Admitting: Gynecology

## 2017-03-11 DIAGNOSIS — Z1231 Encounter for screening mammogram for malignant neoplasm of breast: Secondary | ICD-10-CM

## 2017-03-31 ENCOUNTER — Ambulatory Visit
Admission: RE | Admit: 2017-03-31 | Discharge: 2017-03-31 | Disposition: A | Discharge status: Home / Self Care | Payer: Managed Care, Other (non HMO) | Source: Ambulatory Visit | Attending: Gynecology | Admitting: Gynecology

## 2017-03-31 DIAGNOSIS — Z1231 Encounter for screening mammogram for malignant neoplasm of breast: Secondary | ICD-10-CM

## 2017-05-18 ENCOUNTER — Other Ambulatory Visit: Payer: Self-pay | Admitting: Gynecology

## 2017-05-18 DIAGNOSIS — R7989 Other specified abnormal findings of blood chemistry: Secondary | ICD-10-CM

## 2017-05-21 ENCOUNTER — Telehealth: Payer: Self-pay

## 2017-05-21 ENCOUNTER — Other Ambulatory Visit: Payer: Self-pay | Admitting: Gynecology

## 2017-05-21 NOTE — Telephone Encounter (Signed)
Her [facility] pharmacy sent refill request for 90 days supply of Levothyroxine.  I called patient and left message that Dr. Velvet BatheF had wanted her to have her TSH rechecked towards end of 3 mos on new dose to be sure she is on correct dose. I told her I would hold off refilling it and she should come in and have the level checked to be sure it is right dose.

## 2017-05-25 ENCOUNTER — Other Ambulatory Visit: Payer: Managed Care, Other (non HMO)

## 2017-05-25 DIAGNOSIS — R7989 Other specified abnormal findings of blood chemistry: Secondary | ICD-10-CM

## 2017-05-26 LAB — TSH: TSH: 0.51 mIU/L

## 2017-06-19 ENCOUNTER — Other Ambulatory Visit: Payer: Self-pay | Admitting: Gynecology

## 2017-06-19 NOTE — Telephone Encounter (Signed)
Current annual exam and dose correct at 112 mcg. KW CMA

## 2017-11-27 ENCOUNTER — Encounter: Payer: Self-pay | Admitting: Internal Medicine

## 2017-11-27 ENCOUNTER — Ambulatory Visit: Payer: Managed Care, Other (non HMO) | Admitting: Internal Medicine

## 2017-11-27 DIAGNOSIS — M25511 Pain in right shoulder: Secondary | ICD-10-CM | POA: Insufficient documentation

## 2017-11-27 MED ORDER — PREDNISONE 20 MG PO TABS: 40.0000 mg | ORAL_TABLET | Freq: Every day | ORAL | 0 refills | Status: DC

## 2017-11-27 MED ORDER — KETOROLAC TROMETHAMINE 30 MG/ML IJ SOLN
30.0000 mg | Freq: Once | INTRAMUSCULAR | Status: AC
  Administered 2017-11-27: 30 mg via INTRAMUSCULAR

## 2017-11-27 NOTE — Patient Instructions (Signed)
We have given you a toradol injection here today.   We have sent in the prednisone to take 2 pills daily for 5 days to help the inflammation.   It is okay to use ice on the area or tylenol/aleve for pain.   Try some gentle range of motion exercises as you start feeling better.   Shoulder Exercises Ask your health care provider which exercises are safe for you. Do exercises exactly as told by your health care provider and adjust them as directed. It is normal to feel mild stretching, pulling, tightness, or discomfort as you do these exercises, but you should stop right away if you feel sudden pain or your pain gets worse.Do not begin these exercises until told by your health care provider. RANGE OF MOTION EXERCISES These exercises warm up your muscles and joints and improve the movement and flexibility of your shoulder. These exercises also help to relieve pain, numbness, and tingling. These exercises involve stretching your injured shoulder directly. Exercise A: Pendulum  1. Stand near a wall or a surface that you can hold onto for balance. 2. Bend at the waist and let your left / right arm hang straight down. Use your other arm to support you. Keep your back straight and do not lock your knees. 3. Relax your left / right arm and shoulder muscles, and move your hips and your trunk so your left / right arm swings freely. Your arm should swing because of the motion of your body, not because you are using your arm or shoulder muscles. 4. Keep moving your body so your arm swings in the following directions, as told by your health care provider: ? Side to side. ? Forward and backward. ? In clockwise and counterclockwise circles. 5. Continue each motion for __________ seconds, or for as long as told by your health care provider. 6. Slowly return to the starting position. Repeat __________ times. Complete this exercise __________ times a day. Exercise B:Flexion, Standing  1. Stand and hold a  broomstick, a cane, or a similar object. Place your hands a little more than shoulder-width apart on the object. Your left / right hand should be palm-up, and your other hand should be palm-down. 2. Keep your elbow straight and keep your shoulder muscles relaxed. Push the stick down with your healthy arm to raise your left / right arm in front of your body, and then over your head until you feel a stretch in your shoulder. ? Avoid shrugging your shoulder while you raise your arm. Keep your shoulder blade tucked down toward the middle of your back. 3. Hold for __________ seconds. 4. Slowly return to the starting position. Repeat __________ times. Complete this exercise __________ times a day. Exercise C: Abduction, Standing 1. Stand and hold a broomstick, a cane, or a similar object. Place your hands a little more than shoulder-width apart on the object. Your left / right hand should be palm-up, and your other hand should be palm-down. 2. While keeping your elbow straight and your shoulder muscles relaxed, push the stick across your body toward your left / right side. Raise your left / right arm to the side of your body and then over your head until you feel a stretch in your shoulder. ? Do not raise your arm above shoulder height, unless your health care provider tells you to do that. ? Avoid shrugging your shoulder while you raise your arm. Keep your shoulder blade tucked down toward the middle of your back.  3. Hold for __________ seconds. 4. Slowly return to the starting position. Repeat __________ times. Complete this exercise __________ times a day. Exercise D:Internal Rotation  1. Place your left / right hand behind your back, palm-up. 2. Use your other hand to dangle an exercise band, a towel, or a similar object over your shoulder. Grasp the band with your left / right hand so you are holding onto both ends. 3. Gently pull up on the band until you feel a stretch in the front of your left /  right shoulder. ? Avoid shrugging your shoulder while you raise your arm. Keep your shoulder blade tucked down toward the middle of your back. 4. Hold for __________ seconds. 5. Release the stretch by letting go of the band and lowering your hands. Repeat __________ times. Complete this exercise __________ times a day. STRETCHING EXERCISES These exercises warm up your muscles and joints and improve the movement and flexibility of your shoulder. These exercises also help to relieve pain, numbness, and tingling. These exercises are done using your healthy shoulder to help stretch the muscles of your injured shoulder. Exercise E: Research officer, political partyCorner Stretch (External Rotation and Abduction)  1. Stand in a doorway with one of your feet slightly in front of the other. This is called a staggered stance. If you cannot reach your forearms to the door frame, stand facing a corner of a room. 2. Choose one of the following positions as told by your health care provider: ? Place your hands and forearms on the door frame above your head. ? Place your hands and forearms on the door frame at the height of your head. ? Place your hands on the door frame at the height of your elbows. 3. Slowly move your weight onto your front foot until you feel a stretch across your chest and in the front of your shoulders. Keep your head and chest upright and keep your abdominal muscles tight. 4. Hold for __________ seconds. 5. To release the stretch, shift your weight to your back foot. Repeat __________ times. Complete this stretch __________ times a day. Exercise F:Extension, Standing 1. Stand and hold a broomstick, a cane, or a similar object behind your back. ? Your hands should be a little wider than shoulder-width apart. ? Your palms should face away from your back. 2. Keeping your elbows straight and keeping your shoulder muscles relaxed, move the stick away from your body until you feel a stretch in your shoulder. ? Avoid  shrugging your shoulders while you move the stick. Keep your shoulder blade tucked down toward the middle of your back. 3. Hold for __________ seconds. 4. Slowly return to the starting position. Repeat __________ times. Complete this exercise __________ times a day. STRENGTHENING EXERCISES These exercises build strength and endurance in your shoulder. Endurance is the ability to use your muscles for a long time, even after they get tired. Exercise G:External Rotation  1. Sit in a stable chair without armrests. 2. Secure an exercise band at elbow height on your left / right side. 3. Place a soft object, such as a folded towel or a small pillow, between your left / right upper arm and your body to move your elbow a few inches away (about 10 cm) from your side. 4. Hold the end of the band so it is tight and there is no slack. 5. Keeping your elbow pressed against the soft object, move your left / right forearm out, away from your abdomen. Keep your body steady  so only your forearm moves. 6. Hold for __________ seconds. 7. Slowly return to the starting position. Repeat __________ times. Complete this exercise __________ times a day. Exercise H:Shoulder Abduction  1. Sit in a stable chair without armrests, or stand. 2. Hold a __________ weight in your left / right hand, or hold an exercise band with both hands. 3. Start with your arms straight down and your left / right palm facing in, toward your body. 4. Slowly lift your left / right hand out to your side. Do not lift your hand above shoulder height unless your health care provider tells you that this is safe. ? Keep your arms straight. ? Avoid shrugging your shoulder while you do this movement. Keep your shoulder blade tucked down toward the middle of your back. 5. Hold for __________ seconds. 6. Slowly lower your arm, and return to the starting position. Repeat __________ times. Complete this exercise __________ times a day. Exercise  I:Shoulder Extension 1. Sit in a stable chair without armrests, or stand. 2. Secure an exercise band to a stable object in front of you where it is at shoulder height. 3. Hold one end of the exercise band in each hand. Your palms should face each other. 4. Straighten your elbows and lift your hands up to shoulder height. 5. Step back, away from the secured end of the exercise band, until the band is tight and there is no slack. 6. Squeeze your shoulder blades together as you pull your hands down to the sides of your thighs. Stop when your hands are straight down by your sides. Do not let your hands go behind your body. 7. Hold for __________ seconds. 8. Slowly return to the starting position. Repeat __________ times. Complete this exercise __________ times a day. Exercise J:Standing Shoulder Row 1. Sit in a stable chair without armrests, or stand. 2. Secure an exercise band to a stable object in front of you so it is at waist height. 3. Hold one end of the exercise band in each hand. Your palms should be in a thumbs-up position. 4. Bend each of your elbows to an "L" shape (about 90 degrees) and keep your upper arms at your sides. 5. Step back until the band is tight and there is no slack. 6. Slowly pull your elbows back behind you. 7. Hold for __________ seconds. 8. Slowly return to the starting position. Repeat __________ times. Complete this exercise __________ times a day. Exercise K:Shoulder Press-Ups  1. Sit in a stable chair that has armrests. Sit upright, with your feet flat on the floor. 2. Put your hands on the armrests so your elbows are bent and your fingers are pointing forward. Your hands should be about even with the sides of your body. 3. Push down on the armrests and use your arms to lift yourself off of the chair. Straighten your elbows and lift yourself up as much as you comfortably can. ? Move your shoulder blades down, and avoid letting your shoulders move up toward  your ears. ? Keep your feet on the ground. As you get stronger, your feet should support less of your body weight as you lift yourself up. 4. Hold for __________ seconds. 5. Slowly lower yourself back into the chair. Repeat __________ times. Complete this exercise __________ times a day. Exercise L: Wall Push-Ups  1. Stand so you are facing a stable wall. Your feet should be about one arm-length away from the wall. 2. Lean forward and place your palms  the wall at shoulder height. 3. Keep your feet flat on the floor as you bend your elbows and lean forward toward the wall. 4. Hold for __________ seconds. 5. Straighten your elbows to push yourself back to the starting position. Repeat __________ times. Complete this exercise __________ times a day. This information is not intended to replace advice given to you by your health care provider. Make sure you discuss any questions you have with your health care provider. Document Released: 08/20/2005 Document Revised: 06/30/2016 Document Reviewed: 06/17/2015 Elsevier Interactive Patient Education  2018 [employer]  

## 2017-11-27 NOTE — Assessment & Plan Note (Signed)
Rx for prednisone burst for inflammation. Toradol 30 mg im given at visit for pain. Continue aleve and tylenol at home for pain.

## 2017-11-27 NOTE — Progress Notes (Signed)
   Subjective:    Patient ID: Linda Berger, female    DOB: 11/23/1965, 52 y.o.   MRN: [id number]  HPI The patient is a [age] YO female coming in for acute on chronic neck and shoulder pain on the right. She denies overuse or injury. She is taking aleve and tylenol which is helping some. She is also getting some tingling sensations around her elbow which is new from her chronic problems. 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 She does get carpal tunnel and numbness in her hands which is unchanged recently.   Review of Systems  Constitutional: Positive for activity change. Negative for appetite change, chills, fatigue, fever and unexpected weight change.  Respiratory: Negative.   Cardiovascular: Negative.   Gastrointestinal: Negative.   Musculoskeletal: Positive for arthralgias and myalgias. Negative for back pain, gait problem and joint swelling.  Skin: Negative.   Neurological: Positive for numbness.      Objective:   Physical Exam  Constitutional: She is oriented to person, place, and time. She appears well-developed and well-nourished.  HENT:  Head: Normocephalic and atraumatic.  Eyes: EOM are normal.  Neck: Normal range of motion.  Cardiovascular: Normal rate and regular rhythm.  Pulmonary/Chest: Effort normal and breath sounds normal. No respiratory distress. She has no wheezes. She has no rales.  Abdominal: Soft.  Musculoskeletal: She exhibits tenderness. She exhibits no edema.  Tenderness in the scapular region right and right neck and shoulder region  Neurological: She is alert and oriented to person, place, and time. Coordination normal.  Skin: Skin is warm and dry.   Vitals:   11/27/17 1524  BP: 126/82  Pulse: 74  Temp: 98.4 F (36.9 C)  TempSrc: Oral  SpO2: 99%  Height: 5\' 7"  (1.702 m)      Assessment & Plan:  Toradol 30 mg im

## 2018-01-28 ENCOUNTER — Other Ambulatory Visit: Payer: Self-pay | Admitting: Gynecology

## 2018-01-28 DIAGNOSIS — Z139 Encounter for screening, unspecified: Secondary | ICD-10-CM

## 2018-03-04 ENCOUNTER — Other Ambulatory Visit: Payer: Self-pay | Admitting: Gynecology

## 2018-03-04 NOTE — Telephone Encounter (Signed)
Has CE scheduled 04/01/18.

## 2018-04-01 ENCOUNTER — Ambulatory Visit
Admission: RE | Admit: 2018-04-01 | Discharge: 2018-04-01 | Disposition: A | Discharge status: Home / Self Care | Payer: Managed Care, Other (non HMO) | Source: Ambulatory Visit | Attending: Gynecology | Admitting: Gynecology

## 2018-04-01 ENCOUNTER — Encounter: Payer: Self-pay | Admitting: Gynecology

## 2018-04-01 ENCOUNTER — Ambulatory Visit (INDEPENDENT_AMBULATORY_CARE_PROVIDER_SITE_OTHER): Payer: Managed Care, Other (non HMO) | Admitting: Gynecology

## 2018-04-01 VITALS — BP 118/76 | Ht 67.0 in | Wt 199.0 lb

## 2018-04-01 DIAGNOSIS — E038 Other specified hypothyroidism: Secondary | ICD-10-CM | POA: Diagnosis not present

## 2018-04-01 DIAGNOSIS — Z01419 Encounter for gynecological examination (general) (routine) without abnormal findings: Secondary | ICD-10-CM | POA: Diagnosis not present

## 2018-04-01 DIAGNOSIS — Z139 Encounter for screening, unspecified: Secondary | ICD-10-CM

## 2018-04-01 LAB — TSH: TSH: 0.84 mIU/L

## 2018-04-01 MED ORDER — LEVOTHYROXINE SODIUM 112 MCG PO TABS: ORAL_TABLET | ORAL | 4 refills | Status: DC

## 2018-04-01 MED ORDER — DROSPIRENONE-ETHINYL ESTRADIOL 3-0.02 MG PO TABS: ORAL_TABLET | ORAL | 4 refills | Status: DC

## 2018-04-01 NOTE — Progress Notes (Signed)
    Linda Berger 02/04/1966 [id number]        52 y.o.  G0P0 for annual gynecologic exam.  Doing well without gynecologic complaints  Past medical history,surgical history, problem list, medications, allergies, family history and social history were all reviewed and documented as reviewed in the EPIC chart.  ROS:  Performed with pertinent positives and negatives included in the history, assessment and plan.   Additional significant findings : None   Exam: Kennon PortelaKim Gardner assistant Vitals:   04/01/18 1529  BP: 118/76  Weight: 199 lb (90.3 kg)  Height: 5\' 7"  (1.702 m)   Body mass index is 31.17 kg/m.  General appearance:  Normal affect, orientation and appearance. Skin: Grossly normal HEENT: Without gross lesions.  No cervical or supraclavicular adenopathy. Thyroid normal.  Lungs:  Clear without wheezing, rales or rhonchi Cardiac: RR, without RMG Abdominal:  Soft, nontender, without masses, guarding, rebound, organomegaly or hernia Breasts:  Examined lying and sitting without masses, retractions, discharge or axillary adenopathy. Pelvic:  Ext, BUS, Vagina: Normal  Cervix: Normal  Uterus: Anteverted, normal size, shape and contour, midline and mobile nontender   Adnexa: Without masses or tenderness    Anus and perineum: Normal   Rectovaginal: Normal sphincter tone without palpated masses or tenderness.    Assessment/Plan:  52 y.o. G0P0 female for annual gynecologic exam without menses, continuous BCPs..   1. Continuous BCPs.  Doing well.  Discussed options at age 52 and possible increased risk of thrombosis associated with the pills particularly drospirenone known.  We have had this discussion previously and she understands the increased risk of thrombosis.  Options to stop the pills now and keep a menstrual calendar and symptom calendar and see how she does versus continuing until next year noting average age of menopause at 9651.  At this point the patient desires to continue on the  pills understanding and accepting the risks of stroke heart attack DVT.  Refill x1 year provided.  She is doing well on continuous pills without menses/significant irregular bleeding. 2. Mammography today.  Breast exam normal today. 3. Pap smear/HPV 2015.  No Pap smear done today.  Plan repeat Pap smear next year at 5-year interval per current screening guidelines.  History of dysplasia 1999.  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  Normal Pap smears afterwards. 4. Colonoscopy recommended as she has turned 52 and she agrees to call and schedule. 5. Hypothyroid.  On Synthroid 125 mcg daily.  Check TSH.  Refill x1 year provided. 6. Health maintenance.  No routine lab work done as patient reports this done elsewhere.  Follow-up 1 year, sooner as needed.   Dara Lordsimothy P Kristin Lamagna MD, 3:49 PM 04/01/2018

## 2018-04-01 NOTE — Patient Instructions (Signed)
Schedule your colonoscopy with either:  [facility]   Address: [address], [place], [place] [address]  [phone]    or  [facility]  Address: [address], [place], [place] [address]  [phone]      

## 2018-05-30 ENCOUNTER — Other Ambulatory Visit: Payer: Self-pay | Admitting: Gynecology

## 2018-05-31 ENCOUNTER — Encounter: Payer: Self-pay | Admitting: Gastroenterology

## 2018-07-08 ENCOUNTER — Ambulatory Visit (AMBULATORY_SURGERY_CENTER): Payer: Self-pay | Admitting: *Deleted

## 2018-07-08 ENCOUNTER — Encounter: Payer: Self-pay | Admitting: *Deleted

## 2018-07-08 ENCOUNTER — Encounter: Payer: Self-pay | Admitting: Gastroenterology

## 2018-07-08 VITALS — Ht 67.0 in | Wt 192.8 lb

## 2018-07-08 DIAGNOSIS — Z1211 Encounter for screening for malignant neoplasm of colon: Secondary | ICD-10-CM

## 2018-07-08 MED ORDER — NA SULFATE-K SULFATE-MG SULF 17.5-3.13-1.6 GM/177ML PO SOLN: 1.0000 | Freq: Once | ORAL | 0 refills | Status: AC

## 2018-07-08 NOTE — Progress Notes (Signed)
No egg or soy allergy known to patient  No issues with past sedation with any surgeries  or procedures, no intubation problems  diet pills per patient- pt takes Phentermine 1/2 tab BID_ will stop 10 days before the colon 9-21 SAT  No home 02 use per patient  No blood thinners per patient  Pt denies issues with constipation  No A fib or A flutter  EMMI video sent to pt's e mail pt declined  $15 Suprep coupon to pt in PV today

## 2018-07-20 ENCOUNTER — Encounter: Payer: Self-pay | Admitting: Gastroenterology

## 2018-07-20 ENCOUNTER — Ambulatory Visit (AMBULATORY_SURGERY_CENTER): Payer: Managed Care, Other (non HMO) | Admitting: Gastroenterology

## 2018-07-20 VITALS — BP 134/85 | HR 62 | Temp 97.5°F | Resp 14 | Ht 67.0 in | Wt 192.0 lb

## 2018-07-20 DIAGNOSIS — Z1211 Encounter for screening for malignant neoplasm of colon: Secondary | ICD-10-CM | POA: Diagnosis not present

## 2018-07-20 MED ORDER — SODIUM CHLORIDE 0.9 % IV SOLN: 500.0000 mL | Freq: Once | INTRAVENOUS | Status: DC

## 2018-07-20 NOTE — Progress Notes (Signed)
Pt's states no medical or surgical changes since previsit or office visit. 

## 2018-07-20 NOTE — Op Note (Signed)
[facility] Patient Name: Linda Berger Procedure Date: 07/20/2018 11:12 AM MRN: [id number] Endoscopist: Doristine Locks , MD Age: 52 Referring MD:  Date of Birth: December 11, 1965 Gender: Female Account #: [id number] Procedure:                Colonoscopy Indications:              Screening for colorectal malignant neoplasm, This                            is the patient's first colonoscopy Medicines:                Monitored Anesthesia Care Procedure:                Pre-Anesthesia Assessment:                           - Prior to the procedure, a History and Physical                            was performed, and patient medications and                            allergies were reviewed. The patient's tolerance of                            previous anesthesia was also reviewed. The risks                            and benefits of the procedure and the sedation                            options and risks were discussed with the patient.                            All questions were answered, and informed consent                            was obtained. Prior Anticoagulants: The patient has                            taken no previous anticoagulant or antiplatelet                            agents. ASA Grade Assessment: II - A patient with                            mild systemic disease. After reviewing the risks                            and benefits, the patient was deemed in                            satisfactory condition to undergo the procedure.  After obtaining informed consent, the colonoscope                            was passed under direct vision. Throughout the                            procedure, the patient's blood pressure, pulse, and                            oxygen saturations were monitored continuously. The                            Model PCF-H190DL [id number]) scope was introduced                            through the anus and  advanced to the the cecum,                            identified by appendiceal orifice and ileocecal                            valve. The colonoscopy was performed without                            difficulty. The patient tolerated the procedure                            well. The quality of the bowel preparation was                            adequate. Scope In: 11:22:11 AM Scope Out: 11:38:57 AM Scope Withdrawal Time: 0 hours 10 minutes 7 seconds  Total Procedure Duration: 0 hours 16 minutes 46 seconds  Findings:                 The perianal and digital rectal examinations were                            normal.                           The colon (entire examined portion) appeared normal.                           The retroflexed view of the distal rectum and anal                            verge was normal and showed no anal or rectal                            abnormalities. Complications:            No immediate complications. Estimated Blood Loss:     Estimated blood loss: none. Impression:               - The entire examined colon is normal.                           -  The distal rectum and anal verge are normal on                            retroflexion view.                           - No specimens collected. Recommendation:           - Patient has a contact number available for                            emergencies. The signs and symptoms of potential                            delayed complications were discussed with the                            patient. Return to normal activities tomorrow.                            Written discharge instructions were provided to the                            patient.                           - Resume previous diet today.                           - Continue present medications.                           - Repeat colonoscopy in 10 years for screening                            purposes.                           - Return to GI  office PRN. Doristine Locks, MD 07/20/2018 11:41:43 AM

## 2018-07-20 NOTE — Progress Notes (Signed)
To PACU, VSS. Report to [email or web address] 

## 2018-07-20 NOTE — Patient Instructions (Signed)
Discharge instructions given. Normal exam. Resume previous medications. YOU HAD AN ENDOSCOPIC PROCEDURE TODAY AT THE [facility]:   Refer to the procedure report that was given to you for any specific questions about what was found during the examination.  If the procedure report does not answer your questions, please call your gastroenterologist to clarify.  If you requested that your care partner not be given the details of your procedure findings, then the procedure report has been included in a sealed envelope for you to review at your convenience later.  YOU SHOULD EXPECT: Some feelings of bloating in the abdomen. Passage of more gas than usual.  Walking can help get rid of the air that was put into your GI tract during the procedure and reduce the bloating. If you had a lower endoscopy (such as a colonoscopy or flexible sigmoidoscopy) you may notice spotting of blood in your stool or on the toilet paper. If you underwent a bowel prep for your procedure, you may not have a normal bowel movement for a few days.  Please Note:  You might notice some irritation and congestion in your nose or some drainage.  This is from the oxygen used during your procedure.  There is no need for concern and it should clear up in a day or so.  SYMPTOMS TO REPORT IMMEDIATELY:   Following lower endoscopy (colonoscopy or flexible sigmoidoscopy):  Excessive amounts of blood in the stool  Significant tenderness or worsening of abdominal pains  Swelling of the abdomen that is new, acute  Fever of 100F or higher   For urgent or emergent issues, a gastroenterologist can be reached at any hour by calling [phone].   DIET:  We do recommend a small meal at first, but then you may proceed to your regular diet.  Drink plenty of fluids but you should avoid alcoholic beverages for 24 hours.  ACTIVITY:  You should plan to take it easy for the rest of today and you should NOT DRIVE or use heavy machinery  until tomorrow (because of the sedation medicines used during the test).    FOLLOW UP: Our staff will call the number listed on your records the next business day following your procedure to check on you and address any questions or concerns that you may have regarding the information given to you following your procedure. If we do not reach you, we will leave a message.  However, if you are feeling well and you are not experiencing any problems, there is no need to return our call.  We will assume that you have returned to your regular daily activities without incident.  If any biopsies were taken you will be contacted by phone or by letter within the next 1-3 weeks.  Please call us at [phone] if you have not heard about the biopsies in 3 weeks.    SIGNATURES/CONFIDENTIALITY: You and/or your care partner have signed paperwork which will be entered into your electronic medical record.  These signatures attest to the fact that that the information above on your After Visit Summary has been reviewed and is understood.  Full responsibility of the confidentiality of this discharge information lies with you and/or your care-partner. 

## 2018-07-21 ENCOUNTER — Telehealth: Payer: Self-pay

## 2018-07-21 ENCOUNTER — Encounter: Payer: Managed Care, Other (non HMO) | Admitting: Gastroenterology

## 2018-07-21 NOTE — Telephone Encounter (Signed)
  Follow up Call-  Call back number 07/20/2018  Post procedure Call Back phone  # [phone]  Permission to leave phone message Yes  Some recent data might be hidden     Patient questions:  Do you have a fever, pain , or abdominal swelling? No. Pain Score  0 *  Have you tolerated food without any problems? Yes.    Have you been able to return to your normal activities? Yes.    Do you have any questions about your discharge instructions: Diet   No. Medications  No. Follow up visit  No.  Do you have questions or concerns about your Care? No.  Actions: * If pain score is 4 or above: No action needed, pain <4.

## 2019-02-21 ENCOUNTER — Other Ambulatory Visit: Payer: Self-pay | Admitting: Gynecology

## 2019-02-21 DIAGNOSIS — Z1231 Encounter for screening mammogram for malignant neoplasm of breast: Secondary | ICD-10-CM

## 2019-04-09 ENCOUNTER — Other Ambulatory Visit: Payer: Self-pay | Admitting: Gynecology

## 2019-04-11 NOTE — Telephone Encounter (Signed)
annual exam on 04/13/19

## 2019-04-12 ENCOUNTER — Other Ambulatory Visit: Payer: Self-pay

## 2019-04-13 ENCOUNTER — Ambulatory Visit (INDEPENDENT_AMBULATORY_CARE_PROVIDER_SITE_OTHER): Payer: Managed Care, Other (non HMO) | Admitting: Gynecology

## 2019-04-13 ENCOUNTER — Encounter: Payer: Self-pay | Admitting: Gynecology

## 2019-04-13 VITALS — BP 130/80 | Ht 66.5 in

## 2019-04-13 DIAGNOSIS — Z308 Encounter for other contraceptive management: Secondary | ICD-10-CM

## 2019-04-13 DIAGNOSIS — Z01419 Encounter for gynecological examination (general) (routine) without abnormal findings: Secondary | ICD-10-CM

## 2019-04-13 DIAGNOSIS — Z1151 Encounter for screening for human papillomavirus (HPV): Secondary | ICD-10-CM | POA: Diagnosis not present

## 2019-04-13 DIAGNOSIS — E038 Other specified hypothyroidism: Secondary | ICD-10-CM

## 2019-04-13 MED ORDER — LEVOTHYROXINE SODIUM 112 MCG PO TABS: ORAL_TABLET | ORAL | 4 refills | Status: DC

## 2019-04-13 NOTE — Patient Instructions (Signed)
Follow-up to check your hormone level in several months.  Call sooner if you have any issues after stopping the birth control pills.  Use backup contraception in the interim.

## 2019-04-13 NOTE — Progress Notes (Signed)
    Linda Berger September 15, 1966 [id number]        53 y.o.  G0P0 for annual gynecologic exam.  Doing well without gynecologic complaints  Past medical history,surgical history, problem list, medications, allergies, family history and social history were all reviewed and documented as reviewed in the EPIC chart.  ROS:  Performed with pertinent positives and negatives included in the history, assessment and plan.   Additional significant findings : None   Exam: Caryn Bee assistant Vitals:   04/13/19 0832  BP: 130/80  Height: 5' 6.5" (1.689 m)   Body mass index is 30.53 kg/m.  General appearance:  Normal affect, orientation and appearance. Skin: Grossly normal HEENT: Without gross lesions.  No cervical or supraclavicular adenopathy. Thyroid normal.  Lungs:  Clear without wheezing, rales or rhonchi Cardiac: RR, without RMG Abdominal:  Soft, nontender, without masses, guarding, rebound, organomegaly or hernia Breasts:  Examined lying and sitting without masses, retractions, discharge or axillary adenopathy. Pelvic:  Ext, BUS, Vagina: Normal  Cervix: Normal.  Pap smear/HPV  Uterus: Anteverted, normal size, shape and contour, midline and mobile nontender   Adnexa: Without masses or tenderness    Anus and perineum: Normal   Rectovaginal: Normal sphincter tone without palpated masses or tenderness.    Assessment/Plan:  53 y.o. G0P0 female for annual gynecologic exam.  Without menses, continuous oral contraceptives  1. Continuous BCPs.  Discussed options.  Recommended patient stop pills now.  Keep bleeding and symptom calendar.  Use backup contraception.  Check FSH in 1 to 2 months.  If significant menopausal symptoms but no bleeding will return for HRT discussion.  If irregular bleeding then will reinitiate BCPs for another year.  If no bleeding and no significant symptoms and FSH elevated then will monitor. 2. Mammography scheduled and patient will follow-up for this.  Breast exam  normal today. 3. Colonoscopy 2019.  Repeat at their recommended interval. 4. Pap smear/HPV 2015.  Pap smear/HPV today.  History of dysplasia 1999.  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  Normal Pap smears since. 5. Hypothyroid.  Check TSH today.  Refill Synthroid x1 year. 6. Health maintenance.  No routine lab work done as patient reports a done elsewhere.  Follow-up 1 year, sooner as needed.   Anastasio Auerbach MD, 9:03 AM 04/13/2019

## 2019-04-13 NOTE — Addendum Note (Signed)
Addended by: Nelva Nay on: 04/13/2019 09:20 AM   Modules accepted: Orders

## 2019-04-14 LAB — PAP IG AND HPV HIGH-RISK: HPV DNA High Risk: NOT DETECTED

## 2019-04-14 LAB — FOLLICLE STIMULATING HORMONE: FSH: 2.5 m[IU]/mL

## 2019-04-14 LAB — TSH: TSH: 0.98 mIU/L

## 2019-04-18 ENCOUNTER — Ambulatory Visit
Admission: RE | Admit: 2019-04-18 | Discharge: 2019-04-18 | Disposition: A | Discharge status: Home / Self Care | Payer: Managed Care, Other (non HMO) | Source: Ambulatory Visit | Attending: Gynecology | Admitting: Gynecology

## 2019-04-18 ENCOUNTER — Other Ambulatory Visit: Payer: Self-pay

## 2019-04-18 DIAGNOSIS — Z1231 Encounter for screening mammogram for malignant neoplasm of breast: Secondary | ICD-10-CM

## 2019-05-26 ENCOUNTER — Other Ambulatory Visit: Payer: Self-pay | Admitting: *Deleted

## 2019-05-26 DIAGNOSIS — N912 Amenorrhea, unspecified: Secondary | ICD-10-CM

## 2019-06-02 ENCOUNTER — Encounter: Payer: Self-pay | Admitting: Internal Medicine

## 2019-06-02 ENCOUNTER — Ambulatory Visit (INDEPENDENT_AMBULATORY_CARE_PROVIDER_SITE_OTHER): Payer: Managed Care, Other (non HMO) | Admitting: Internal Medicine

## 2019-06-02 DIAGNOSIS — G5602 Carpal tunnel syndrome, left upper limb: Secondary | ICD-10-CM

## 2019-06-02 NOTE — Progress Notes (Signed)
Virtual Visit via Video Note  I connected with Linda Berger on 06/02/19 at  8:20 AM EDT by a video enabled telemedicine application and verified that I am speaking with the correct person using two identifiers.  The patient and the provider were at separate locations throughout the entire encounter.   I discussed the limitations of evaluation and management by telemedicine and the availability of in person appointments. The patient expressed understanding and agreed to proceed.  History of Present Illness: The patient is a 53 y.o. female with visit for left hand numbness and pain. Started years ago but in the last 6 month has been more consistent and worsening steadily. Has numbness and pain in the first 3 fingers. Worse in the night and gets a little better throughout the day. Does work at home now due to pandemic. Denies pain or numbness in 4-5 fingers left hand. Has mild similar symptoms on the right but not bothering her a lot. She has a carpal tunnel brace but this is not helping and was seeming to make this worse so she stopped using it after a week or so. Overall it is worsening. Has tried carpal tunnel brace  Observations/Objective: Appearance: normal, breathing appears normal, tinel positive, casual grooming, abdomen does not appear distended, throat normal, memory normal, mental status is A and O times 3  Assessment and Plan: See problem oriented charting  Follow Up Instructions: referral hand surgery  I discussed the assessment and treatment plan with the patient. The patient was provided an opportunity to ask questions and all were answered. The patient agreed with the plan and demonstrated an understanding of the instructions.   The patient was advised to call back or seek an in-person evaluation if the symptoms worsen or if the condition fails to improve as anticipated.  Hoyt Koch, MD

## 2019-06-02 NOTE — Assessment & Plan Note (Signed)
Referral to hand surgery. Given pandemic it makes sense to treat rather than refer for nerve conduction given severe symptoms.

## 2019-06-06 ENCOUNTER — Other Ambulatory Visit: Payer: Self-pay | Admitting: Gynecology

## 2019-07-08 ENCOUNTER — Other Ambulatory Visit: Payer: Managed Care, Other (non HMO)

## 2019-07-11 ENCOUNTER — Other Ambulatory Visit: Payer: Managed Care, Other (non HMO)

## 2019-07-11 ENCOUNTER — Other Ambulatory Visit: Payer: Self-pay

## 2019-07-11 DIAGNOSIS — N912 Amenorrhea, unspecified: Secondary | ICD-10-CM

## 2019-07-11 LAB — FOLLICLE STIMULATING HORMONE: FSH: 117.2 m[IU]/mL — ABNORMAL HIGH

## 2019-07-12 ENCOUNTER — Encounter: Payer: Self-pay | Admitting: Gynecology

## 2019-07-13 ENCOUNTER — Encounter: Payer: Self-pay | Admitting: Gynecology

## 2020-01-30 ENCOUNTER — Encounter: Payer: Self-pay | Admitting: Internal Medicine

## 2020-03-15 ENCOUNTER — Other Ambulatory Visit: Payer: Self-pay | Admitting: Obstetrics and Gynecology

## 2020-03-15 DIAGNOSIS — Z1231 Encounter for screening mammogram for malignant neoplasm of breast: Secondary | ICD-10-CM

## 2020-04-12 ENCOUNTER — Other Ambulatory Visit: Payer: Self-pay

## 2020-04-13 ENCOUNTER — Encounter: Payer: Self-pay | Admitting: Obstetrics and Gynecology

## 2020-04-13 ENCOUNTER — Ambulatory Visit (INDEPENDENT_AMBULATORY_CARE_PROVIDER_SITE_OTHER): Payer: Managed Care, Other (non HMO) | Admitting: Obstetrics and Gynecology

## 2020-04-13 VITALS — BP 120/78 | Ht 67.0 in

## 2020-04-13 DIAGNOSIS — E038 Other specified hypothyroidism: Secondary | ICD-10-CM

## 2020-04-13 DIAGNOSIS — Z01419 Encounter for gynecological examination (general) (routine) without abnormal findings: Secondary | ICD-10-CM

## 2020-04-13 DIAGNOSIS — N951 Menopausal and female climacteric states: Secondary | ICD-10-CM

## 2020-04-13 NOTE — Progress Notes (Signed)
CALEE NUGENT Jul 06, 1966 [id number]  SUBJECTIVE:  54 y.o. G0P0 female for annual routine gynecologic exam. She has no gynecologic concerns.  She came off of continuous birth control pills last year, which she was taking for PMDD.  FSH level was in the menopausal range last year after being off the OCPs.  She did have a period this last month in May.  No bleeding since that time.   Current Outpatient Medications  Medication Sig Dispense Refill  . escitalopram (LEXAPRO) 10 MG tablet TAKE 1 TABLET BY MOUTH EVERY DAY 90 tablet 3  . fluticasone (FLONASE) 50 MCG/ACT nasal spray Place into both nostrils daily.    Marland Kitchen levothyroxine (SYNTHROID) 112 MCG tablet One pill daily 90 tablet 4  . loratadine (CLARITIN) 10 MG tablet Take 10 mg by mouth daily.     No current facility-administered medications for this visit.   Allergies: Aspirin and Erythromycin  No LMP recorded. (Menstrual status: Oral contraceptives).  Past medical history,surgical history, problem list, medications, allergies, family history and social history were all reviewed and documented as reviewed in the EPIC chart.  ROS:  Feeling well. No dyspnea or chest pain on exertion.  No abdominal pain, change in bowel habits, black or bloody stools.  No urinary tract symptoms. GYN ROS: no abnormal bleeding, pelvic pain or discharge, no breast pain or new or enlarging lumps on self exam. No neurological complaints.    OBJECTIVE:  BP 120/78   Ht 5\' 7"  (1.702 m)   BMI 30.07 kg/m  The patient appears well, alert, oriented x 3, in no distress. ENT normal.  Neck supple. No cervical or supraclavicular adenopathy or thyromegaly.  Lungs are clear, good air entry, no wheezes, rhonchi or rales. S1 and S2 normal, no murmurs, regular rate and rhythm.  Abdomen soft without tenderness, guarding, mass or organomegaly.  Neurological is normal, no focal findings.  BREAST EXAM: breasts appear normal, no suspicious masses, no skin or nipple changes or  axillary nodes  PELVIC EXAM: VULVA: normal appearing vulva with no masses, tenderness or lesions, VAGINA: normal appearing vagina with normal color and discharge, no lesions, CERVIX: normal appearing cervix without discharge or lesions, UTERUS: uterus is normal size, shape, consistency and nontender, ADNEXA: normal adnexa in size, nontender and no masses  Chaperone: present during the examination  ASSESSMENT:  54 y.o. G0P0 here for annual gynecologic exam  PLAN:   1. Perimenopausal.  FSH of 117 on 07/11/2019 after she discontinued birth control pills.  LMP was last month.  We discussed potential scenarios in the upcoming months including sporadic and irregular periods amenorrhea.  Amenorrhea for one whole year is what defines menopause.  If any abnormal bleeding concerns she should let 07/13/2019 know.  Minimal hot flashes and sleep disturbances, denies need for any medication at this time.  She is on Lexapro for the history of PMDD and feels mood is doing well with taking that, and the Lexapro could help with vasomotor symptoms as well. 2. Pap smear/HPV 2020.  History of cervical dysplasia 1999, ASCUS positive high-risk HPV in 2000, 2001, 2002.  Normal Pap smears since that time.  Next Pap smear due 2025 following the current guidelines recommending the 5 year cotesting interval.  3. Mammogram scheduled for 04/18/2020.  Normal breast exam today.   4. Colonoscopy 2019.  Recommended that she follow up at the recommended interval.   5. Health maintenance.  History of hypothyroidism on replacement, she does request checking a TSH today which is  ordered.  She has her other routine labs drawn elsewhere.  Return annually or sooner, prn.  Joseph Pierini MD 04/13/20

## 2020-04-14 LAB — TSH: TSH: 0.12 mIU/L — ABNORMAL LOW

## 2020-04-17 NOTE — Progress Notes (Signed)
TSH is low, may need to decrease synthroid dose. Does she have someone managing her thyroid level?

## 2020-04-18 ENCOUNTER — Other Ambulatory Visit: Payer: Self-pay

## 2020-04-18 ENCOUNTER — Ambulatory Visit
Admission: RE | Admit: 2020-04-18 | Discharge: 2020-04-18 | Disposition: A | Discharge status: Home / Self Care | Payer: Managed Care, Other (non HMO) | Source: Ambulatory Visit | Attending: Obstetrics and Gynecology | Admitting: Obstetrics and Gynecology

## 2020-04-18 DIAGNOSIS — Z1231 Encounter for screening mammogram for malignant neoplasm of breast: Secondary | ICD-10-CM

## 2020-04-18 DIAGNOSIS — R7989 Other specified abnormal findings of blood chemistry: Secondary | ICD-10-CM

## 2020-04-18 MED ORDER — LEVOTHYROXINE SODIUM 100 MCG PO TABS: 100.0000 ug | ORAL_TABLET | Freq: Every day | ORAL | 2 refills | Status: DC

## 2020-04-18 NOTE — Progress Notes (Signed)
Okay, have her reduce her Synthroid dose to 100 MCG daily (currently listed at 112 MCG dose) and recheck the TSH in 2 to 3 months.  Thanks

## 2020-06-11 ENCOUNTER — Other Ambulatory Visit: Payer: Self-pay

## 2020-06-11 MED ORDER — ESCITALOPRAM OXALATE 10 MG PO TABS: 10.0000 mg | ORAL_TABLET | Freq: Every day | ORAL | 4 refills | Status: DC

## 2020-07-10 ENCOUNTER — Other Ambulatory Visit: Payer: Self-pay | Admitting: Obstetrics and Gynecology

## 2020-07-27 ENCOUNTER — Other Ambulatory Visit: Payer: Managed Care, Other (non HMO)

## 2020-07-27 ENCOUNTER — Other Ambulatory Visit: Payer: Self-pay

## 2020-07-27 DIAGNOSIS — R7989 Other specified abnormal findings of blood chemistry: Secondary | ICD-10-CM

## 2020-07-27 LAB — TSH: TSH: 0.19 mIU/L — ABNORMAL LOW

## 2020-07-31 ENCOUNTER — Other Ambulatory Visit: Payer: Self-pay

## 2020-07-31 DIAGNOSIS — R7989 Other specified abnormal findings of blood chemistry: Secondary | ICD-10-CM

## 2020-08-01 MED ORDER — LEVOTHYROXINE SODIUM 75 MCG PO TABS: 75.0000 ug | ORAL_TABLET | Freq: Every day | ORAL | 0 refills | Status: DC

## 2020-09-27 ENCOUNTER — Encounter: Payer: Self-pay | Admitting: Internal Medicine

## 2020-10-24 ENCOUNTER — Encounter: Payer: Self-pay | Admitting: Obstetrics and Gynecology

## 2020-10-24 ENCOUNTER — Other Ambulatory Visit: Payer: Self-pay | Admitting: Obstetrics and Gynecology

## 2020-10-24 DIAGNOSIS — R7989 Other specified abnormal findings of blood chemistry: Secondary | ICD-10-CM

## 2020-10-25 ENCOUNTER — Other Ambulatory Visit: Payer: Self-pay

## 2020-10-25 DIAGNOSIS — R7989 Other specified abnormal findings of blood chemistry: Secondary | ICD-10-CM

## 2020-10-29 ENCOUNTER — Other Ambulatory Visit: Payer: Self-pay | Admitting: *Deleted

## 2020-10-29 ENCOUNTER — Other Ambulatory Visit: Payer: Self-pay

## 2020-10-29 ENCOUNTER — Other Ambulatory Visit (INDEPENDENT_AMBULATORY_CARE_PROVIDER_SITE_OTHER): Payer: Managed Care, Other (non HMO)

## 2020-10-29 DIAGNOSIS — R7989 Other specified abnormal findings of blood chemistry: Secondary | ICD-10-CM

## 2020-10-29 LAB — TSH: TSH: 5.3 mIU/L — ABNORMAL HIGH

## 2020-10-30 ENCOUNTER — Other Ambulatory Visit: Payer: Self-pay

## 2020-10-30 DIAGNOSIS — R7989 Other specified abnormal findings of blood chemistry: Secondary | ICD-10-CM

## 2020-10-30 MED ORDER — LEVOTHYROXINE SODIUM 100 MCG PO TABS: 100.0000 ug | ORAL_TABLET | Freq: Every day | ORAL | 1 refills | Status: DC

## 2020-11-04 ENCOUNTER — Encounter (INDEPENDENT_AMBULATORY_CARE_PROVIDER_SITE_OTHER): Payer: Self-pay

## 2020-11-05 ENCOUNTER — Other Ambulatory Visit: Payer: Self-pay

## 2020-11-05 ENCOUNTER — Ambulatory Visit (INDEPENDENT_AMBULATORY_CARE_PROVIDER_SITE_OTHER): Payer: Managed Care, Other (non HMO) | Admitting: Family Medicine

## 2020-11-06 ENCOUNTER — Ambulatory Visit: Payer: Managed Care, Other (non HMO) | Admitting: Internal Medicine

## 2020-11-06 ENCOUNTER — Encounter: Payer: Self-pay | Admitting: Internal Medicine

## 2020-11-06 DIAGNOSIS — L0291 Cutaneous abscess, unspecified: Secondary | ICD-10-CM | POA: Diagnosis not present

## 2020-11-06 MED ORDER — AMOXICILLIN-POT CLAVULANATE 875-125 MG PO TABS: 1.0000 | ORAL_TABLET | Freq: Two times a day (BID) | ORAL | 0 refills | Status: AC

## 2020-11-06 NOTE — Patient Instructions (Addendum)
We have sent in augmentin to take 1 pill twice a day for 5 days.    

## 2020-11-06 NOTE — Progress Notes (Signed)
   Subjective:   Patient ID: Linda Berger, female    DOB: 1966/08/14, 55 y.o.   MRN: [id number]  HPI The patient is a 56 YO female coming in for red area on left breast. Started about a week or so ago. Is having pain at the red area. Denies drainage. Has tried nothing. Overall improving. Last mammogram June 2021 without findings. Stopped wearing underwire bras which has seemed to help.  Review of Systems  Constitutional: Negative.   HENT: Negative.   Eyes: Negative.   Respiratory: Negative for cough, chest tightness and shortness of breath.   Cardiovascular: Negative for chest pain, palpitations and leg swelling.  Gastrointestinal: Negative for abdominal distention, abdominal pain, constipation, diarrhea, nausea and vomiting.  Musculoskeletal: Negative.   Skin: Positive for color change.  Neurological: Negative.   Psychiatric/Behavioral: Negative.     Objective:  Physical Exam Constitutional:      Appearance: She is well-developed and well-nourished.  HENT:     Head: Normocephalic and atraumatic.  Eyes:     Extraocular Movements: EOM normal.  Cardiovascular:     Rate and Rhythm: Normal rate and regular rhythm.     Comments: Left breast 2 o'clock area redness on the skin with small abscess without fluctuance, no drainage expressible Pulmonary:     Effort: Pulmonary effort is normal. No respiratory distress.     Breath sounds: Normal breath sounds. No wheezing or rales.  Abdominal:     General: Bowel sounds are normal. There is no distension.     Palpations: Abdomen is soft.     Tenderness: There is no abdominal tenderness. There is no rebound.  Musculoskeletal:        General: No edema.     Cervical back: Normal range of motion.  Skin:    General: Skin is warm and dry.  Neurological:     Mental Status: She is alert and oriented to person, place, and time.     Coordination: Coordination normal.  Psychiatric:        Mood and Affect: Mood and affect normal.     Vitals:    11/06/20 1221  BP: 122/60  Pulse: 69  Resp: 18  Temp: 98.7 F (37.1 C)  TempSrc: Oral  SpO2: 96%  Height: 5\' 7"  (1.702 m)    This visit occurred during the SARS-CoV-2 public health emergency.  Safety protocols were in place, including screening questions prior to the visit, additional usage of staff PPE, and extensive cleaning of exam room while observing appropriate contact time as indicated for disinfecting solutions.   Assessment & Plan:

## 2020-11-06 NOTE — Assessment & Plan Note (Signed)
Rx augmentin 5 day course. Overall appears to be resolving and shrinking. I and D not indicated today given lack of fluctuance. Call back for lack of resolution.

## 2020-11-15 ENCOUNTER — Other Ambulatory Visit: Payer: Self-pay

## 2020-11-15 ENCOUNTER — Ambulatory Visit (INDEPENDENT_AMBULATORY_CARE_PROVIDER_SITE_OTHER): Payer: Managed Care, Other (non HMO) | Admitting: Family Medicine

## 2020-11-15 ENCOUNTER — Encounter (INDEPENDENT_AMBULATORY_CARE_PROVIDER_SITE_OTHER): Payer: Self-pay | Admitting: Family Medicine

## 2020-11-15 VITALS — BP 104/70 | HR 65 | Temp 98.1°F | Ht 67.0 in | Wt 212.0 lb

## 2020-11-15 DIAGNOSIS — E559 Vitamin D deficiency, unspecified: Secondary | ICD-10-CM | POA: Insufficient documentation

## 2020-11-15 DIAGNOSIS — R0602 Shortness of breath: Secondary | ICD-10-CM | POA: Diagnosis not present

## 2020-11-15 DIAGNOSIS — E038 Other specified hypothyroidism: Secondary | ICD-10-CM

## 2020-11-15 DIAGNOSIS — F39 Unspecified mood [affective] disorder: Secondary | ICD-10-CM | POA: Insufficient documentation

## 2020-11-15 DIAGNOSIS — Z9189 Other specified personal risk factors, not elsewhere classified: Secondary | ICD-10-CM | POA: Diagnosis not present

## 2020-11-15 DIAGNOSIS — R5383 Other fatigue: Secondary | ICD-10-CM | POA: Diagnosis not present

## 2020-11-15 DIAGNOSIS — Z6833 Body mass index (BMI) 33.0-33.9, adult: Secondary | ICD-10-CM

## 2020-11-15 DIAGNOSIS — E669 Obesity, unspecified: Secondary | ICD-10-CM

## 2020-11-15 DIAGNOSIS — Z0289 Encounter for other administrative examinations: Secondary | ICD-10-CM

## 2020-11-16 LAB — CBC WITH DIFFERENTIAL/PLATELET
Basophils Absolute: 0 10*3/uL (ref 0.0–0.2)
Basos: 0 %
EOS (ABSOLUTE): 0 10*3/uL (ref 0.0–0.4)
Eos: 0 %
Hematocrit: 41.4 % (ref 34.0–46.6)
Hemoglobin: 14.3 g/dL (ref 11.1–15.9)
Immature Grans (Abs): 0 10*3/uL (ref 0.0–0.1)
Immature Granulocytes: 1 %
Lymphocytes Absolute: 2.5 10*3/uL (ref 0.7–3.1)
Lymphs: 36 %
MCH: 30 pg (ref 26.6–33.0)
MCHC: 34.5 g/dL (ref 31.5–35.7)
MCV: 87 fL (ref 79–97)
Monocytes Absolute: 0.4 10*3/uL (ref 0.1–0.9)
Monocytes: 6 %
Neutrophils Absolute: 3.9 10*3/uL (ref 1.4–7.0)
Neutrophils: 57 %
Platelets: 369 10*3/uL (ref 150–450)
RBC: 4.77 x10E6/uL (ref 3.77–5.28)
RDW: 13.3 % (ref 11.7–15.4)
WBC: 6.9 10*3/uL (ref 3.4–10.8)

## 2020-11-16 LAB — COMPREHENSIVE METABOLIC PANEL
ALT: 20 IU/L (ref 0–32)
AST: 20 IU/L (ref 0–40)
Albumin/Globulin Ratio: 1.7 (ref 1.2–2.2)
Albumin: 4.4 g/dL (ref 3.8–4.9)
Alkaline Phosphatase: 71 IU/L (ref 44–121)
BUN/Creatinine Ratio: 12 (ref 9–23)
BUN: 9 mg/dL (ref 6–24)
Bilirubin Total: 0.8 mg/dL (ref 0.0–1.2)
CO2: 22 mmol/L (ref 20–29)
Calcium: 9 mg/dL (ref 8.7–10.2)
Chloride: 107 mmol/L — ABNORMAL HIGH (ref 96–106)
Creatinine, Ser: 0.75 mg/dL (ref 0.57–1.00)
GFR calc Af Amer: 106 mL/min/{1.73_m2} (ref >59)
GFR calc non Af Amer: 92 mL/min/{1.73_m2} (ref >59)
Globulin, Total: 2.6 g/dL (ref 1.5–4.5)
Glucose: 78 mg/dL (ref 65–99)
Potassium: 4.4 mmol/L (ref 3.5–5.2)
Sodium: 141 mmol/L (ref 134–144)
Total Protein: 7 g/dL (ref 6.0–8.5)

## 2020-11-16 LAB — LIPID PANEL
Chol/HDL Ratio: 3 ratio (ref 0.0–4.4)
Cholesterol, Total: 157 mg/dL (ref 100–199)
HDL: 52 mg/dL (ref >39)
LDL Chol Calc (NIH): 80 mg/dL (ref 0–99)
Triglycerides: 141 mg/dL (ref 0–149)
VLDL Cholesterol Cal: 25 mg/dL (ref 5–40)

## 2020-11-16 LAB — VITAMIN D 25 HYDROXY (VIT D DEFICIENCY, FRACTURES): Vit D, 25-Hydroxy: 10.7 ng/mL — ABNORMAL LOW (ref 30.0–100.0)

## 2020-11-16 LAB — INSULIN, RANDOM: INSULIN: 5.6 u[IU]/mL (ref 2.6–24.9)

## 2020-11-16 LAB — VITAMIN B12: Vitamin B-12: 595 pg/mL (ref 232–1245)

## 2020-11-16 LAB — HEMOGLOBIN A1C
Est. average glucose Bld gHb Est-mCnc: 111 mg/dL
Hgb A1c MFr Bld: 5.5 % (ref 4.8–5.6)

## 2020-11-16 LAB — FOLATE: Folate: 7.3 ng/mL (ref >3.0)

## 2020-11-19 ENCOUNTER — Ambulatory Visit (INDEPENDENT_AMBULATORY_CARE_PROVIDER_SITE_OTHER): Payer: Managed Care, Other (non HMO) | Admitting: Family Medicine

## 2020-11-19 NOTE — Progress Notes (Signed)
Chief Complaint:   OBESITY DOMITILA Berger (MR# [id number]) is a 55 y.o. female who presents for evaluation and treatment of obesity and related comorbidities. Current BMI is Body mass index is 33.2 kg/m. [name] has been struggling with her weight for many years and has been unsuccessful in either losing weight, maintaining weight loss, or reaching her healthy weight goal.  Linda Berger is currently in the action stage of change and ready to dedicate time achieving and maintaining a healthier weight. Linda Berger is interested in becoming our patient and working on intensive lifestyle modifications including (but not limited to) diet and exercise for weight loss.  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 She lives with her 48 year old husband, Linda Berger. She craves sweet and salty. She snacks on cookies, nuts, and cheese n crackers.  [name]'s habits were reviewed today and are as follows: Her family eats meals together, her desired weight loss is 47 lbs, she has been heavy most of her life, she started gaining weight over the last 10 years, her heaviest weight ever was 215 pounds, she has significant food cravings issues, she snacks frequently in the evenings, she is frequently drinking liquids with calories, she frequently makes poor food choices, she has problems with excessive hunger, she frequently eats larger portions than normal and she struggles with emotional eating.  This is the patient's first visit at [facility].  The patient's NEW PATIENT PACKET that they filled out prior to today's office visit was reviewed at length and information from that paperwork was included within the following office visit note.    Included in the packet: current and past health history, medications, allergies, ROS, gynecologic history (women only), surgical history, family history, social history, weight history, weight loss surgery history (for those that have had weight loss surgery), nutritional  evaluation, mood and food questionnaire along with a depression screening (PHQ9) on all patients, an Epworth questionnaire, sleep habits questionnaire, patient life and health improvement goals questionnaire. These will all be scanned into the patient's chart under media.   During the visit, I independently reviewed the patient's EKG, bioimpedance scale results, and indirect calorimeter results. I used this information to tailor a meal plan for the patient that will help Linda Berger to lose weight and will improve her obesity-related conditions going forward.  I performed a medically necessary appropriate examination and/or evaluation. I discussed the assessment and treatment plan with the patient. The patient was provided an opportunity to ask questions and all were answered. The patient agreed with the plan and demonstrated an understanding of the instructions. Labs were ordered today (unless patient declined them) and will be reviewed with the patient at our next visit unless more critical results need to be addressed immediately. Clinical information was updated and documented in the EMR.  Time spent on visit including pre-visit chart review and post-visit care was estimated to be 52+ minutes.   Depression Screen Rudolph's Food and Mood (modified PHQ-9) score was 10.  Depression screen [facility] 2/9 11/15/2020  Decreased Interest 0  Down, Depressed, Hopeless 1  PHQ - 2 Score 1  Altered sleeping 2  Tired, decreased energy 2  Change in appetite 2  Feeling bad or failure about yourself  2  Trouble concentrating 1  Moving slowly or fidgety/restless 0  Suicidal thoughts 0  PHQ-9 Score 10  Difficult doing work/chores Not difficult at all    Assessment/Plan:   1. Other fatigue Linda denies daytime  somnolence and denies waking up still tired. Patent has a history of symptoms of none. Linda Berger generally gets 7 or 8 hours of sleep per night, and states that she has generally restful sleep. Snoring is present. Apneic  episodes are not present. Epworth Sleepiness Score is 2.  Plan: [name] does feel that her weight is causing her energy to be lower than it should be. Fatigue may be related to obesity, depression or many other causes. Labs will be ordered, and in the meanwhile, Linda Berger will focus on self care including making healthy food choices, increasing physical activity and focusing on stress reduction.  Lab/Orders today or future: - EKG 12-Lead - Vitamin B12 - CBC with Differential/Platelet - Comprehensive metabolic panel - Folate - Hemoglobin A1c - Insulin, random - Lipid panel - VITAMIN D 25 Hydroxy (Vit-D Deficiency, Fractures)  2. SOBOE (shortness of breath on exertion) [name] notes increasing shortness of breath with exercising and seems to be worsening over time with weight gain. She notes getting out of breath sooner with activity than she used to. This has gotten worse recently. Linda Berger denies shortness of breath at rest or orthopnea.  Plan: Linda Berger does feel that she gets out of breath more easily that she used to when she exercises. Linda Berger's shortness of breath appears to be obesity related and exercise induced. She has agreed to work on weight loss and gradually increase exercise to treat her exercise induced shortness of breath. Will continue to monitor closely.  Lab/Orders today or future: - Vitamin B12 - CBC with Differential/Platelet - Comprehensive metabolic panel - Folate - Hemoglobin A1c - Insulin, random - Lipid panel - VITAMIN D 25 Hydroxy (Vit-D Deficiency, Fractures)  3. Other specified hypothyroidism Treated and followed by GYN, Dr. Penni Berger. She is taking Synthroid. Recently changed dose on October 30, 2020.  Lab Results  Component Value Date   TSH 5.30 (H) 10/29/2020   Plan: Stable. Continue current treatment plan. Check labs Feb 22-December 18, 2020. Patient with long-standing hypothyroidism, on levothyroxine therapy. She appears euthyroid. Orders and follow up as documented in  patient record.  Counseling . Good thyroid control is important for overall health. Supratherapeutic thyroid levels are dangerous and will not improve weight loss results. . The correct way to take levothyroxine is fasting, with water, separated by at least 30 minutes from breakfast, and separated by more than 4 hours from calcium, iron, multivitamins, acid reflux medications (PPIs).   Lab/Orders today or future: - Vitamin B12 - CBC with Differential/Platelet - Comprehensive metabolic panel - Folate - Hemoglobin A1c - Insulin, random - Lipid panel - VITAMIN D 25 Hydroxy (Vit-D Deficiency, Fractures)  4. Vitamin D deficiency Corrissa is currently taking no vitamin D supplement. She denies nausea, vomiting or muscle weakness.  Plan: Treatment pending lab results. Low Vitamin D level contributes to fatigue and are associated with obesity, breast, and colon cancer. She agrees to follow-up for routine testing of Vitamin D, at least 2-3 times per year to avoid over-replacement.  Lab/Orders today or future: - Vitamin B12 - CBC with Differential/Platelet - Comprehensive metabolic panel - Folate - Hemoglobin A1c - Insulin, random - Lipid panel - VITAMIN D 25 Hydroxy (Vit-D Deficiency, Fractures)  5. Mood disorder (HCC)- EE  Harrison's symptoms are controlled at the current time. She is taking Lexapro. She denies feeling depressed currently. She is struggling with emotional eating and using food for comfort to the extent that it is negatively impacting her health. She has been working on behavior modification techniques  to help reduce her emotional eating. She shows no sign of suicidal or homicidal ideations.  Plan: Continue current treatment plan. We will monitor. Behavior modification techniques were discussed today to help Quana deal with her emotional/non-hunger eating behaviors.  Orders and follow up as documented in patient record.   Lab/Orders today or future: - Vitamin B12 - CBC with  Differential/Platelet - Comprehensive metabolic panel - Folate - Hemoglobin A1c - Insulin, random - Lipid panel - VITAMIN D 25 Hydroxy (Vit-D Deficiency, Fractures)  6. At risk for malnutrition Litzi was given approximately 24 minutes of counseling today regarding prevention of malnutrition and ways to meet macronutrient goals..   7. Class 1 obesity with serious comorbidity and body mass index (BMI) of 33.0 to 33.9 in adult, unspecified obesity type Berger is currently in the action stage of change and her goal is to continue with weight loss efforts. I recommend Vaniyah begin the structured treatment plan as follows:  She has agreed to the Category 2 Plan.  Exercise goals: As is   Behavioral modification strategies: increasing lean protein intake, increasing water intake, no skipping meals, meal planning and cooking strategies and planning for success.  She was informed of the importance of frequent follow-up visits to maximize her success with intensive lifestyle modifications for her multiple health conditions. She was informed we would discuss her lab results at her next visit unless there is a critical issue that needs to be addressed sooner. Masyn agreed to keep her next visit at the agreed upon time to discuss these results.  Objective:   Blood pressure 104/70, pulse 65, temperature 98.1 F (36.7 C), height 5\' 7"  (1.702 m), weight 212 lb (96.2 kg), last menstrual period 10/26/2020, SpO2 97 %. Body mass index is 33.2 kg/m.  EKG: Normal sinus rhythm, rate 66.  Indirect Calorimeter completed today shows a VO2 of 283 and a REE of 1970.  Her calculated basal metabolic rate is 12/24/2020 thus her basal metabolic rate is better than expected.  General: Cooperative, alert, well developed, in no acute distress. HEENT: Conjunctivae and lids unremarkable. Cardiovascular: Regular rhythm.  Lungs: Normal work of breathing. Neurologic: No focal deficits.   Lab Results  Component Value Date    CREATININE 0.75 11/15/2020   BUN 9 11/15/2020   NA 141 11/15/2020   K 4.4 11/15/2020   CL 107 (H) 11/15/2020   CO2 22 11/15/2020   Lab Results  Component Value Date   ALT 20 11/15/2020   AST 20 11/15/2020   ALKPHOS 71 11/15/2020   BILITOT 0.8 11/15/2020   Lab Results  Component Value Date   HGBA1C 5.5 11/15/2020   Lab Results  Component Value Date   INSULIN 5.6 11/15/2020   Lab Results  Component Value Date   TSH 5.30 (H) 10/29/2020   Lab Results  Component Value Date   CHOL 157 11/15/2020   HDL 52 11/15/2020   LDLCALC 80 11/15/2020   TRIG 141 11/15/2020   CHOLHDL 3.0 11/15/2020   Lab Results  Component Value Date   WBC 6.9 11/15/2020   HGB 14.3 11/15/2020   HCT 41.4 11/15/2020   MCV 87 11/15/2020   PLT 369 11/15/2020    Attestation Statements:   Reviewed by clinician on day of visit: allergies, medications, problem list, medical history, surgical history, family history, social history, and previous encounter notes.  11/17/2020, am acting as Edmund Hilda for Energy manager, DO.  I have reviewed the above documentation for accuracy and completeness, and I agree  with the above. Carlye Grippe, D.O.  The 21st Century Cures Act was signed into law in 2016 which includes the topic of electronic health records.  This provides immediate access to information in MyChart.  This includes consultation notes, operative notes, office notes, lab results and pathology reports.  If you have any questions about what you read please let us know at your next visit so we can discuss your concerns and take corrective action if need be.  We are right here with you.

## 2020-11-28 ENCOUNTER — Other Ambulatory Visit: Payer: Self-pay

## 2020-11-28 ENCOUNTER — Encounter (INDEPENDENT_AMBULATORY_CARE_PROVIDER_SITE_OTHER): Payer: Self-pay | Admitting: Family Medicine

## 2020-11-28 ENCOUNTER — Ambulatory Visit (INDEPENDENT_AMBULATORY_CARE_PROVIDER_SITE_OTHER): Payer: Managed Care, Other (non HMO) | Admitting: Family Medicine

## 2020-11-28 VITALS — BP 95/59 | HR 76 | Temp 98.6°F | Ht 67.0 in | Wt 210.0 lb

## 2020-11-28 DIAGNOSIS — Z9189 Other specified personal risk factors, not elsewhere classified: Secondary | ICD-10-CM | POA: Diagnosis not present

## 2020-11-28 DIAGNOSIS — E669 Obesity, unspecified: Secondary | ICD-10-CM | POA: Diagnosis not present

## 2020-11-28 DIAGNOSIS — E038 Other specified hypothyroidism: Secondary | ICD-10-CM

## 2020-11-28 DIAGNOSIS — Z6832 Body mass index (BMI) 32.0-32.9, adult: Secondary | ICD-10-CM

## 2020-11-28 DIAGNOSIS — E8881 Metabolic syndrome: Secondary | ICD-10-CM | POA: Diagnosis not present

## 2020-11-28 DIAGNOSIS — E559 Vitamin D deficiency, unspecified: Secondary | ICD-10-CM

## 2020-11-28 DIAGNOSIS — E88819 Insulin resistance, unspecified: Secondary | ICD-10-CM | POA: Insufficient documentation

## 2020-11-28 MED ORDER — VITAMIN D (ERGOCALCIFEROL) 1.25 MG (50000 UNIT) PO CAPS: 50000.0000 [IU] | ORAL_CAPSULE | ORAL | 0 refills | Status: DC

## 2020-12-03 NOTE — Progress Notes (Signed)
Chief Complaint:   OBESITY Linda Berger is here to discuss her progress with her obesity treatment plan along with follow-up of her obesity related diagnoses.   Today's visit was #: 2 Starting weight: 212 lbs Starting date: 11/15/2020 Today's weight: 210 lbs Today's date: 11/28/2020 Total lbs lost to date: 2 lbs Body mass index is 32.89 kg/m.  Total weight loss percentage to date: -0.94%  Interim History:  Linda Berger is here today to review her NEW Meal Plan and to discuss all recent labs done here and/ or done at outside facilities. This is patient's first follow up visit. Extended time was spent counseling Linda Berger on all new disease processes that were discovered or that are worsening.   Linda Berger says that she has been snacking on cucumbers, tomatoes, apples, and yogurt.  She has been drinking 64 ounces of water daily.  Nutrition Plan: Category 2 Plan for 65-70% of the time. Activity: None at this time.  Assessment/Plan:   1. Insulin resistance New.  Discussed labs with patient today.  Goal is HgbA1c < 5.7, fasting insulin closer to 5.  Medication: None.   A1c is 5.5.  Plan:  Handout given after counseled on condition and treatment.  She will continue to focus on protein-rich, low simple carbohydrate foods. We reviewed the importance of hydration, regular exercise for stress reduction, and restorative sleep.   Lab Results  Component Value Date   HGBA1C 5.5 11/15/2020   Lab Results  Component Value Date   INSULIN 5.6 11/15/2020   2. Vitamin D deficiency New.  Discussed labs with patient today.  Not at goal. Current vitamin D is 10.7, tested on 11/15/2020. Optimal goal > 50 ng/dL.   Plan:  Start to take prescription Vitamin D @50 ,000 IU every week as prescribed.  Follow-up for routine testing of Vitamin D, at least 2-3 times per year to avoid over-replacement.  - Start Vitamin D, Ergocalciferol, (DRISDOL) 1.25 MG (50000 UNIT) CAPS capsule; Take 1 capsule (50,000 Units  total) by mouth every 7 (seven) days.  Dispense: 4 capsule; Refill: 0  3. Other specified hypothyroidism Discussed labs with patient today.  Medication: levothyroxine 100 mcg daily.  Dose was changed on 10/30/2020 by her GYN.   Plan:  Recheck thyroid panel in early Marh 2022.  Continue current dose.    Lab Results  Component Value Date   TSH 5.30 (H) 10/29/2020   4. At risk for diabetes mellitus - Linda Berger was given diabetes prevention education and counseling today of more than 24 minutes.  - Counseled patient on pathophysiology of disease and meaning/ implication of lab results.  - Reviewed how certain foods can either stimulate or inhibit insulin release, and subsequently affect hunger pathways  - Importance of following a healthy meal plan with limiting amounts of simple carbohydrates discussed with patient - Effects of regular aerobic exercise on blood sugar regulation reviewed and encouraged an eventual goal of 30 min 5d/week or more as a minimum.  - Briefly discussed treatment options, which always include dietary and lifestyle modification as first line.   - Handouts provided at patient's desire and/or told to go online to the American Diabetes Association website for further information.  5. Class 1 obesity with serious comorbidity and body mass index (BMI) of 32.0 to 32.9 in adult, unspecified obesity type  Course: Linda Berger is currently in the action stage of change. As such, her goal is to continue with weight loss efforts.   Nutrition goals: She has  agreed to the Category 2 Plan.   Exercise goals: As is.  Behavioral modification strategies: increasing lean protein intake, decreasing simple carbohydrates, increasing water intake, decreasing sodium intake, meal planning and cooking strategies and planning for success.  Linda Berger has agreed to follow-up with our clinic in 2 weeks. She was informed of the importance of frequent follow-up visits to maximize her success with intensive lifestyle  modifications for her multiple health conditions.   Objective:   Blood pressure (!) 95/59, pulse 76, temperature 98.6 F (37 C), height 5\' 7"  (1.702 m), weight 210 lb (95.3 kg), SpO2 96 %. Body mass index is 32.89 kg/m.  General: Cooperative, alert, well developed, in no acute distress. HEENT: Conjunctivae and lids unremarkable. Cardiovascular: Regular rhythm.  Lungs: Normal work of breathing. Neurologic: No focal deficits.   Lab Results  Component Value Date   CREATININE 0.75 11/15/2020   BUN 9 11/15/2020   NA 141 11/15/2020   K 4.4 11/15/2020   CL 107 (H) 11/15/2020   CO2 22 11/15/2020   Lab Results  Component Value Date   ALT 20 11/15/2020   AST 20 11/15/2020   ALKPHOS 71 11/15/2020   BILITOT 0.8 11/15/2020   Lab Results  Component Value Date   HGBA1C 5.5 11/15/2020   Lab Results  Component Value Date   INSULIN 5.6 11/15/2020   Lab Results  Component Value Date   TSH 5.30 (H) 10/29/2020   Lab Results  Component Value Date   CHOL 157 11/15/2020   HDL 52 11/15/2020   LDLCALC 80 11/15/2020   TRIG 141 11/15/2020   CHOLHDL 3.0 11/15/2020   Lab Results  Component Value Date   WBC 6.9 11/15/2020   HGB 14.3 11/15/2020   HCT 41.4 11/15/2020   MCV 87 11/15/2020   PLT 369 11/15/2020   Attestation Statements:   Reviewed by clinician on day of visit: allergies, medications, problem list, medical history, surgical history, family history, social history, and previous encounter notes.  I, 11/17/2020, CMA, am acting as Insurance claims handler for Energy manager, DO.  I have reviewed the above documentation for accuracy and completeness, and I agree with the above. [employer], D.O.  The 21st Century Cures Act was signed into law in 2016 which includes the topic of electronic health records.  This provides immediate access to information in MyChart.  This includes consultation notes, operative notes, office notes, lab results and pathology reports.  If you  have any questions about what you read please let 2017 know at your next visit so we can discuss your concerns and take corrective action if need be.  We are right here with you.

## 2020-12-10 ENCOUNTER — Encounter: Payer: Self-pay | Admitting: Obstetrics and Gynecology

## 2020-12-12 ENCOUNTER — Other Ambulatory Visit: Payer: Self-pay

## 2020-12-12 ENCOUNTER — Other Ambulatory Visit (INDEPENDENT_AMBULATORY_CARE_PROVIDER_SITE_OTHER): Payer: Managed Care, Other (non HMO)

## 2020-12-12 DIAGNOSIS — R7989 Other specified abnormal findings of blood chemistry: Secondary | ICD-10-CM

## 2020-12-12 LAB — TSH: TSH: 0.25 mIU/L — ABNORMAL LOW

## 2020-12-13 ENCOUNTER — Encounter: Payer: Self-pay | Admitting: Internal Medicine

## 2020-12-19 ENCOUNTER — Other Ambulatory Visit (INDEPENDENT_AMBULATORY_CARE_PROVIDER_SITE_OTHER): Payer: Self-pay | Admitting: Family Medicine

## 2020-12-19 ENCOUNTER — Other Ambulatory Visit: Payer: Self-pay

## 2020-12-19 ENCOUNTER — Ambulatory Visit (INDEPENDENT_AMBULATORY_CARE_PROVIDER_SITE_OTHER): Payer: Managed Care, Other (non HMO) | Admitting: Family Medicine

## 2020-12-19 ENCOUNTER — Encounter (INDEPENDENT_AMBULATORY_CARE_PROVIDER_SITE_OTHER): Payer: Self-pay | Admitting: Family Medicine

## 2020-12-19 ENCOUNTER — Encounter: Payer: Managed Care, Other (non HMO) | Admitting: Internal Medicine

## 2020-12-19 VITALS — BP 110/76 | HR 65 | Temp 97.9°F | Ht 67.0 in | Wt 210.0 lb

## 2020-12-19 DIAGNOSIS — E038 Other specified hypothyroidism: Secondary | ICD-10-CM

## 2020-12-19 DIAGNOSIS — Z6833 Body mass index (BMI) 33.0-33.9, adult: Secondary | ICD-10-CM | POA: Diagnosis not present

## 2020-12-19 DIAGNOSIS — Z9189 Other specified personal risk factors, not elsewhere classified: Secondary | ICD-10-CM | POA: Diagnosis not present

## 2020-12-19 DIAGNOSIS — E559 Vitamin D deficiency, unspecified: Secondary | ICD-10-CM

## 2020-12-19 DIAGNOSIS — E669 Obesity, unspecified: Secondary | ICD-10-CM | POA: Diagnosis not present

## 2020-12-19 MED ORDER — VITAMIN D (ERGOCALCIFEROL) 1.25 MG (50000 UNIT) PO CAPS
50000.0000 [IU] | ORAL_CAPSULE | ORAL | 0 refills | Status: DC
Start: 2020-12-19 — End: 2021-01-10

## 2020-12-19 NOTE — Telephone Encounter (Signed)
Dr.Opalski ?

## 2020-12-20 NOTE — Progress Notes (Signed)
Chief Complaint:   OBESITY Linda Berger is here to discuss her progress with her obesity treatment plan along with follow-up of her obesity related diagnoses.   Today's visit was #: 3 Starting weight: 212 lbs Starting date: 11/15/2020 Today's weight: 210 lbs Today's date: 12/19/2020 Total lbs lost to date: 2 lbs Body mass index is 32.89 kg/m.  Total weight loss percentage to date: -0.94%  Interim History:  Linda Berger is here for a follow up office visit.  she is following the meal plan without concern or issues.  Patient's meal and food recall appears to be accurate and consistent with what is on the plan.  When on plan, her hunger and cravings are well controlled.    Plan:  Keep a journal as to why she wants to get healthy.  Current Meal Plan: the Category 2 Plan for 50% of the time.  Current Exercise Plan: None.  Assessment/Plan:   1. Other specified hypothyroidism Medication: Synthroid 100 mcg daily.   Asymptomatic.  Having a difficult time regulating her levels.   Plan: Patient was instructed not to take MVM or iron within 4 hours of taking thyroid medications. This issue is managed by her PCP. We will continue to monitor alongside Endocrinology/PCP as it relates to her weight loss journey.  Continue prudent nutritional plan and weight loss.  Lab Results  Component Value Date   TSH 0.25 (L) 12/12/2020   2. Vitamin D deficiency Not at goal. Current vitamin D is 10.7, tested on 11/15/2020. Optimal goal > 50 ng/dL.  She is taking vitamin D 50,000 IU daily.  Plan: Continue to take prescription Vitamin D @50 ,000 IU every week as prescribed.  Follow-up for routine testing of Vitamin D, at least 2-3 times per year to avoid over-replacement.  - Refill Vitamin D, Ergocalciferol, (DRISDOL) 1.25 MG (50000 UNIT) CAPS capsule; Take 1 capsule (50,000 Units total) by mouth every 7 (seven) days.  Dispense: 4 capsule; Refill: 0  3. At risk for osteoporosis Abigale was given approximately 15  minutes of osteoporosis prevention counseling today.   [name] is at risk for osteopenia and osteoporosis due to Vitamin D deficiency, as well as other risk factors.  We discussed the importance of prudent screenings through her PCP's office for prevention.     [name] was encouraged to take her Vitamin D and follow her calcium rich diet.  We will continue to monitor vitamin D levels to ensure treatment is appropriate.   It is recommended that she eventually engage in weight bearing exercises and muscle strengthening exercises to help improve bone density and decrease her risk of osteopenia and osteoporosis.  4. Class 1 obesity with serious comorbidity and body mass index (BMI) of 33.0 to 33.9 in adult, unspecified obesity type  Course: Linda Berger is currently in the action stage of change. As such, her goal is to continue with weight loss efforts.   Nutrition goals: She has agreed to the Category 2 Plan.   Exercise goals: As is.  Behavioral modification strategies: meal planning and cooking strategies and planning for success.  [name] has agreed to follow-up with our clinic in 2 weeks. She was informed of the importance of frequent follow-up visits to maximize her success with intensive lifestyle modifications for her multiple health conditions.   Objective:   Blood pressure 110/76, pulse 65, temperature 97.9 F (36.6 C), height 5\' 7"  (1.702 m), weight 210 lb (95.3 kg), SpO2 98 %. Body mass index is 32.89 kg/m.  General: Cooperative,  alert, well developed, in no acute distress. HEENT: Conjunctivae and lids unremarkable. Cardiovascular: Regular rhythm.  Lungs: Normal work of breathing. Neurologic: No focal deficits.   Lab Results  Component Value Date   CREATININE 0.75 11/15/2020   BUN 9 11/15/2020   NA 141 11/15/2020   K 4.4 11/15/2020   CL 107 (H) 11/15/2020   CO2 22 11/15/2020   Lab Results  Component Value Date   ALT 20 11/15/2020   AST 20 11/15/2020   ALKPHOS 71 11/15/2020   BILITOT  0.8 11/15/2020   Lab Results  Component Value Date   HGBA1C 5.5 11/15/2020   Lab Results  Component Value Date   INSULIN 5.6 11/15/2020   Lab Results  Component Value Date   TSH 0.25 (L) 12/12/2020   Lab Results  Component Value Date   CHOL 157 11/15/2020   HDL 52 11/15/2020   LDLCALC 80 11/15/2020   TRIG 141 11/15/2020   CHOLHDL 3.0 11/15/2020   Lab Results  Component Value Date   WBC 6.9 11/15/2020   HGB 14.3 11/15/2020   HCT 41.4 11/15/2020   MCV 87 11/15/2020   PLT 369 11/15/2020   Attestation Statements:   Reviewed by clinician on day of visit: allergies, medications, problem list, medical history, surgical history, family history, social history, and previous encounter notes.  I, Insurance claims handler, CMA, am acting as Energy manager for [employer], DO.  I have reviewed the above documentation for accuracy and completeness, and I agree with the above. Carlye Grippe, D.O.  The 21st Century Cures Act was signed into law in 2016 which includes the topic of electronic health records.  This provides immediate access to information in MyChart.  This includes consultation notes, operative notes, office notes, lab results and pathology reports.  If you have any questions about what you read please let us know at your next visit so we can discuss your concerns and take corrective action if need be.  We are right here with you.

## 2020-12-21 ENCOUNTER — Other Ambulatory Visit (INDEPENDENT_AMBULATORY_CARE_PROVIDER_SITE_OTHER): Payer: Self-pay | Admitting: Family Medicine

## 2020-12-21 DIAGNOSIS — E559 Vitamin D deficiency, unspecified: Secondary | ICD-10-CM

## 2020-12-24 NOTE — Telephone Encounter (Signed)
Dr.Opalski ?

## 2020-12-26 ENCOUNTER — Encounter: Payer: Managed Care, Other (non HMO) | Admitting: Internal Medicine

## 2020-12-28 ENCOUNTER — Encounter (INDEPENDENT_AMBULATORY_CARE_PROVIDER_SITE_OTHER): Payer: Self-pay | Admitting: Family Medicine

## 2021-01-01 ENCOUNTER — Ambulatory Visit (INDEPENDENT_AMBULATORY_CARE_PROVIDER_SITE_OTHER): Payer: Managed Care, Other (non HMO) | Admitting: Family Medicine

## 2021-01-10 ENCOUNTER — Ambulatory Visit (INDEPENDENT_AMBULATORY_CARE_PROVIDER_SITE_OTHER): Payer: Managed Care, Other (non HMO) | Admitting: Internal Medicine

## 2021-01-10 ENCOUNTER — Other Ambulatory Visit: Payer: Self-pay

## 2021-01-10 ENCOUNTER — Encounter: Payer: Self-pay | Admitting: Internal Medicine

## 2021-01-10 VITALS — BP 124/70 | HR 69 | Temp 98.2°F | Resp 18 | Ht 67.0 in

## 2021-01-10 DIAGNOSIS — E038 Other specified hypothyroidism: Secondary | ICD-10-CM

## 2021-01-10 DIAGNOSIS — Z0000 Encounter for general adult medical examination without abnormal findings: Secondary | ICD-10-CM | POA: Insufficient documentation

## 2021-01-10 DIAGNOSIS — Z0001 Encounter for general adult medical examination with abnormal findings: Secondary | ICD-10-CM

## 2021-01-10 DIAGNOSIS — F39 Unspecified mood [affective] disorder: Secondary | ICD-10-CM

## 2021-01-10 DIAGNOSIS — E559 Vitamin D deficiency, unspecified: Secondary | ICD-10-CM

## 2021-01-10 DIAGNOSIS — Z23 Encounter for immunization: Secondary | ICD-10-CM | POA: Diagnosis not present

## 2021-01-10 DIAGNOSIS — Z1159 Encounter for screening for other viral diseases: Secondary | ICD-10-CM

## 2021-01-10 DIAGNOSIS — G5601 Carpal tunnel syndrome, right upper limb: Secondary | ICD-10-CM | POA: Diagnosis not present

## 2021-01-10 LAB — VITAMIN D 25 HYDROXY (VIT D DEFICIENCY, FRACTURES): VITD: 26.84 ng/mL — ABNORMAL LOW (ref 30.00–100.00)

## 2021-01-10 LAB — T4, FREE: Free T4: 0.93 ng/dL (ref 0.60–1.60)

## 2021-01-10 LAB — TSH: TSH: 0.47 u[IU]/mL (ref 0.35–4.50)

## 2021-01-10 MED ORDER — VITAMIN D (ERGOCALCIFEROL) 1.25 MG (50000 UNIT) PO CAPS: 50000.0000 [IU] | ORAL_CAPSULE | ORAL | 3 refills | Status: DC

## 2021-01-10 NOTE — Assessment & Plan Note (Signed)
Needs TSH and free T4 to assess and will adjust synthroid 100 mcg daily if needed. She would like [place] to resume managing this.

## 2021-01-10 NOTE — Assessment & Plan Note (Signed)
Checking vitamin D level after about 6 weeks of high dose replacement and did refill this today. Adjust as needed after labs.

## 2021-01-10 NOTE — Assessment & Plan Note (Signed)
She has carpal tunnel brace at home. Referral to hand surgery done today as she would like to explore surgical option for this.

## 2021-01-10 NOTE — Progress Notes (Signed)
   Subjective:   Patient ID: Linda Berger, female    DOB: [birth year], 55 y.o.   MRN: [id number]  HPI The patient is a 55 YO female coming in for physical with new concerns.  HPI #2: Here for follow up new vitamin D deficiency (she has taken about 6 weeks of vitamin D replacement, diagnosed at healthy weight clinic, would like refill of this, denies noticing change since starting), and thyroid (gyn has been managing dosage for her, this got too low and dose was increased, then was too high and dose was reduced, she has been on 100 mcg daily since mid-February now, she is feeling like it is too low, has been on thyroid medication since age 61, denies hot flashes, when levels were too high she could tell and was more jittery and problems with sleeping) and new carpal tunnel right hand (previously had on the left which was operated on, she in the last few months has noticed on the right, some numbness in the fingers, some pain intermittent, would like to proceed to hand surgeon for consideration of surgical fix for this).   PMH, [facility], social history reviewed and updated  Review of Systems  Constitutional: Positive for fatigue.  HENT: Negative.   Eyes: Negative.   Respiratory: Negative for cough, chest tightness and shortness of breath.   Cardiovascular: Negative for chest pain, palpitations and leg swelling.  Gastrointestinal: Negative for abdominal distention, abdominal pain, constipation, diarrhea, nausea and vomiting.  Musculoskeletal: Negative.   Skin: Negative.   Neurological: Negative.   Psychiatric/Behavioral: Negative.     Objective:  Physical Exam Constitutional:      Appearance: She is well-developed.  HENT:     Head: Normocephalic and atraumatic.  Cardiovascular:     Rate and Rhythm: Normal rate and regular rhythm.  Pulmonary:     Effort: Pulmonary effort is normal. No respiratory distress.     Breath sounds: Normal breath sounds. No wheezing or rales.  Abdominal:      General: Bowel sounds are normal. There is no distension.     Palpations: Abdomen is soft.     Tenderness: There is no abdominal tenderness. There is no rebound.  Musculoskeletal:     Cervical back: Normal range of motion.  Skin:    General: Skin is warm and dry.  Neurological:     Mental Status: She is alert and oriented to person, place, and time.     Coordination: Coordination normal.     Vitals:   01/10/21 0819  BP: 124/70  Pulse: 69  Resp: 18  Temp: 98.2 F (36.8 C)  TempSrc: Oral  SpO2: 97%  Height: 5\' 7"  (1.702 m)    This visit occurred during the SARS-CoV-2 public health emergency.  Safety protocols were in place, including screening questions prior to the visit, additional usage of staff PPE, and extensive cleaning of exam room while observing appropriate contact time as indicated for disinfecting solutions.   Assessment & Plan:  Shingrix IM given at visit

## 2021-01-10 NOTE — Patient Instructions (Signed)
Health Maintenance, Female Adopting a healthy lifestyle and getting preventive care are important in promoting health and wellness. Ask your health care provider about:  The right schedule for you to have regular tests and exams.  Things you can do on your own to prevent diseases and keep yourself healthy. What should I know about diet, weight, and exercise? Eat a healthy diet  Eat a diet that includes plenty of vegetables, fruits, low-fat dairy products, and lean protein.  Do not eat a lot of foods that are high in solid fats, added sugars, or sodium.   Maintain a healthy weight Body mass index (BMI) is used to identify weight problems. It estimates body fat based on height and weight. Your health care provider can help determine your BMI and help you achieve or maintain a healthy weight. Get regular exercise Get regular exercise. This is one of the most important things you can do for your health. Most adults should:  Exercise for at least 150 minutes each week. The exercise should increase your heart rate and make you sweat (moderate-intensity exercise).  Do strengthening exercises at least twice a week. This is in addition to the moderate-intensity exercise.  Spend less time sitting. Even light physical activity can be beneficial. Watch cholesterol and blood lipids Have your blood tested for lipids and cholesterol at 53 years of age, then have this test every 5 years. Have your cholesterol levels checked more often if:  Your lipid or cholesterol levels are high.  You are older than 53 years of age.  You are at high risk for heart disease. What should I know about cancer screening? Depending on your health history and family history, you may need to have cancer screening at various ages. This may include screening for:  Breast cancer.  Cervical cancer.  Colorectal cancer.  Skin cancer.  Lung cancer. What should I know about heart disease, diabetes, and high blood  pressure? Blood pressure and heart disease  High blood pressure causes heart disease and increases the risk of stroke. This is more likely to develop in people who have high blood pressure readings, are of African descent, or are overweight.  Have your blood pressure checked: ? Every 3-5 years if you are 18-39 years of age. ? Every year if you are 40 years old or older. Diabetes Have regular diabetes screenings. This checks your fasting blood sugar level. Have the screening done:  Once every three years after age 40 if you are at a normal weight and have a low risk for diabetes.  More often and at a younger age if you are overweight or have a high risk for diabetes. What should I know about preventing infection? Hepatitis B If you have a higher risk for hepatitis B, you should be screened for this virus. Talk with your health care provider to find out if you are at risk for hepatitis B infection. Hepatitis C Testing is recommended for:  Everyone born from 1945 through 1965.  Anyone with known risk factors for hepatitis C. Sexually transmitted infections (STIs)  Get screened for STIs, including gonorrhea and chlamydia, if: ? You are sexually active and are younger than 53 years of age. ? You are older than 53 years of age and your health care provider tells you that you are at risk for this type of infection. ? Your sexual activity has changed since you were last screened, and you are at increased risk for chlamydia or gonorrhea. Ask your health care provider   if you are at risk.  Ask your health care provider about whether you are at high risk for HIV. Your health care provider may recommend a prescription medicine to help prevent HIV infection. If you choose to take medicine to prevent HIV, you should first get tested for HIV. You should then be tested every 3 months for as long as you are taking the medicine. Pregnancy  If you are about to stop having your period (premenopausal) and  you may become pregnant, seek counseling before you get pregnant.  Take 400 to 800 micrograms (mcg) of folic acid every day if you become pregnant.  Ask for birth control (contraception) if you want to prevent pregnancy. Osteoporosis and menopause Osteoporosis is a disease in which the bones lose minerals and strength with aging. This can result in bone fractures. If you are 65 years old or older, or if you are at risk for osteoporosis and fractures, ask your health care provider if you should:  Be screened for bone loss.  Take a calcium or vitamin D supplement to lower your risk of fractures.  Be given hormone replacement therapy (HRT) to treat symptoms of menopause. Follow these instructions at home: Lifestyle  Do not use any products that contain nicotine or tobacco, such as cigarettes, e-cigarettes, and chewing tobacco. If you need help quitting, ask your health care provider.  Do not use street drugs.  Do not share needles.  Ask your health care provider for help if you need support or information about quitting drugs. Alcohol use  Do not drink alcohol if: ? Your health care provider tells you not to drink. ? You are pregnant, may be pregnant, or are planning to become pregnant.  If you drink alcohol: ? Limit how much you use to 0-1 drink a day. ? Limit intake if you are breastfeeding.  Be aware of how much alcohol is in your drink. In the [place] one drink equals one 12 oz bottle of beer (355 mL), one 5 oz glass of wine (148 mL), or one 1 oz glass of hard liquor (44 mL). General instructions  Schedule regular health, dental, and eye exams.  Stay current with your vaccines.  Tell your health care provider if: ? You often feel depressed. ? You have ever been abused or do not feel safe at home. Summary  Adopting a healthy lifestyle and getting preventive care are important in promoting health and wellness.  Follow your health care provider's instructions about healthy  diet, exercising, and getting tested or screened for diseases.  Follow your health care provider's instructions on monitoring your cholesterol and blood pressure. This information is not intended to replace advice given to you by your health care provider. Make sure you discuss any questions you have with your health care provider. Document Revised: 09/29/2018 Document Reviewed: 09/29/2018 Elsevier Patient Education  2021 Elsevier Inc.  

## 2021-01-10 NOTE — Addendum Note (Signed)
Addended by: Manuela Schwartz on: 01/10/2021 10:30 AM   Modules accepted: Orders

## 2021-01-10 NOTE — Assessment & Plan Note (Signed)
Taking lexapro 10 mg daily and doing well overall. Will continue.

## 2021-01-10 NOTE — Assessment & Plan Note (Signed)
Flu shot up to date. Covid-19 up to date. Shingrix 2nd given today to complete series. Tetanus counseled declines. Colonoscopy up to date. Mammogram up to date, pap smear up to date. Counseled about sun safety and mole surveillance. Counseled about the dangers of distracted driving. Given 10 year screening recommendations.

## 2021-01-11 ENCOUNTER — Encounter: Payer: Self-pay | Admitting: Internal Medicine

## 2021-01-11 DIAGNOSIS — R7989 Other specified abnormal findings of blood chemistry: Secondary | ICD-10-CM

## 2021-01-11 LAB — HEPATITIS C ANTIBODY
Hepatitis C Ab: NONREACTIVE
SIGNAL TO CUT-OFF: 0 (ref <1.00)

## 2021-01-11 MED ORDER — LEVOTHYROXINE SODIUM 100 MCG PO TABS: 100.0000 ug | ORAL_TABLET | Freq: Every day | ORAL | 3 refills | Status: DC

## 2021-02-07 ENCOUNTER — Other Ambulatory Visit: Payer: Self-pay | Admitting: Internal Medicine

## 2021-02-07 DIAGNOSIS — Z1231 Encounter for screening mammogram for malignant neoplasm of breast: Secondary | ICD-10-CM

## 2021-02-18 ENCOUNTER — Encounter (INDEPENDENT_AMBULATORY_CARE_PROVIDER_SITE_OTHER): Payer: Self-pay | Admitting: Family Medicine

## 2021-02-18 NOTE — Telephone Encounter (Signed)
Pt last seen by Dr. Opalski.  

## 2021-03-23 IMAGING — MG DIGITAL SCREENING BILAT W/ CAD
4 series · 4 of 4 positions shown · non-contrast
Comparison: Previous exam(s).

CLINICAL DATA: Screening.

EXAM:
DIGITAL SCREENING BILATERAL MAMMOGRAM WITH CAD

[L MLO]
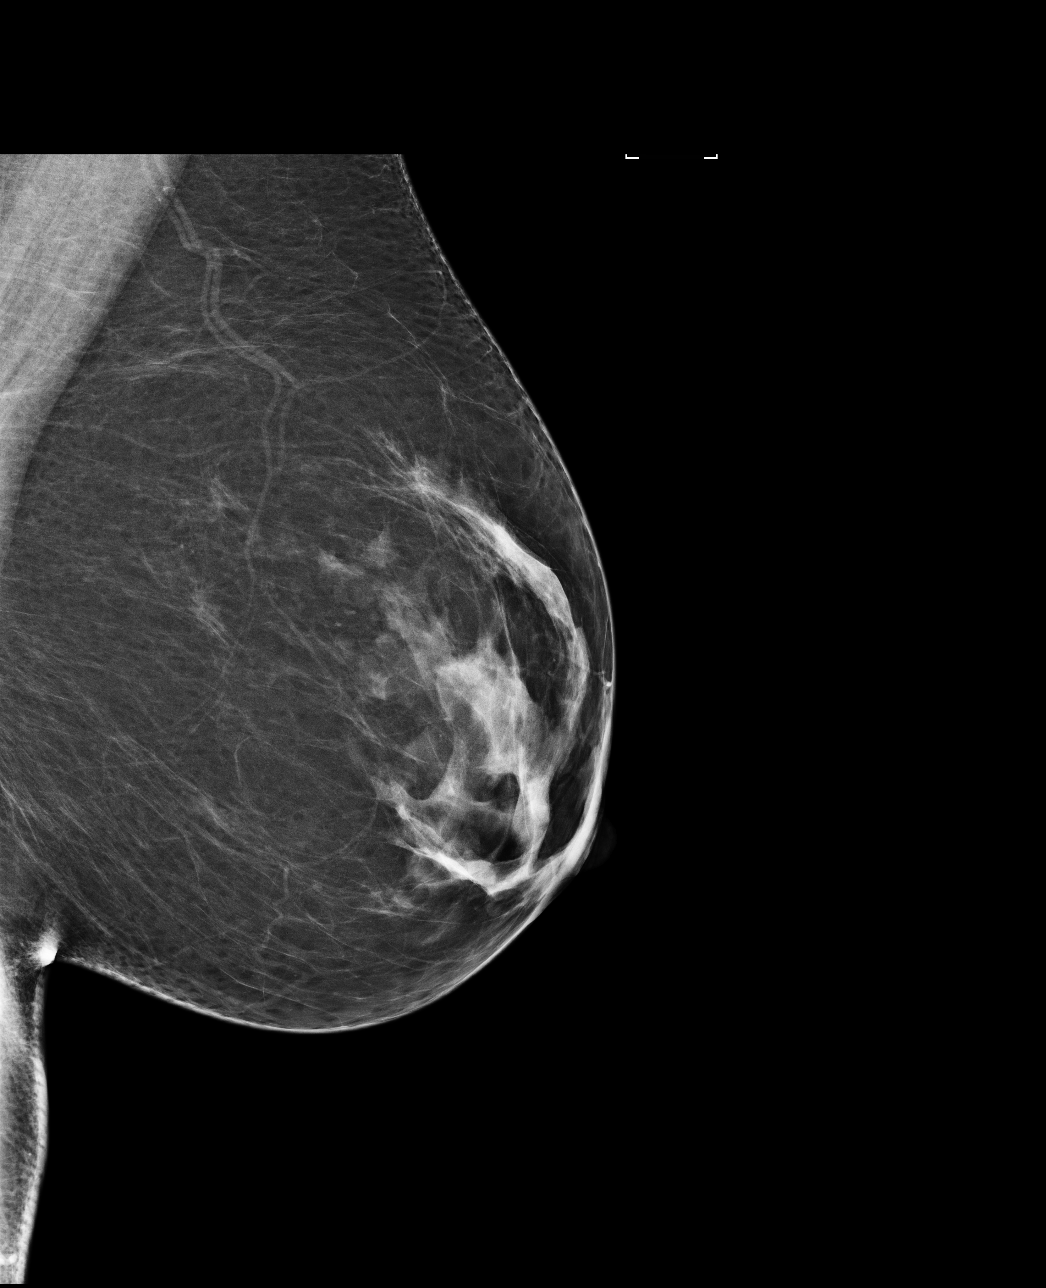

[R CC]
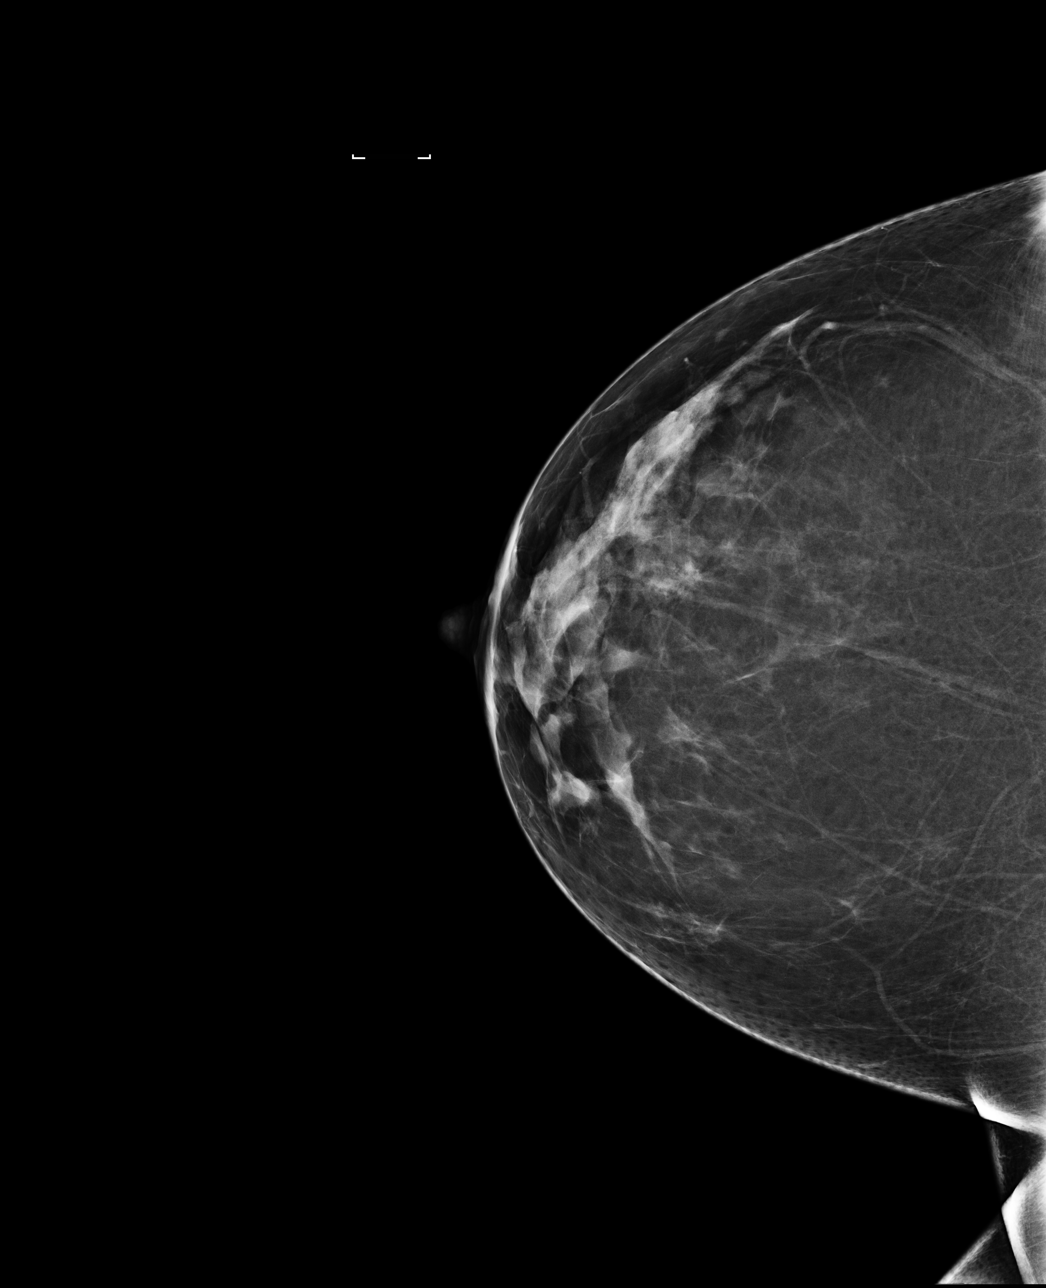

[L CC]
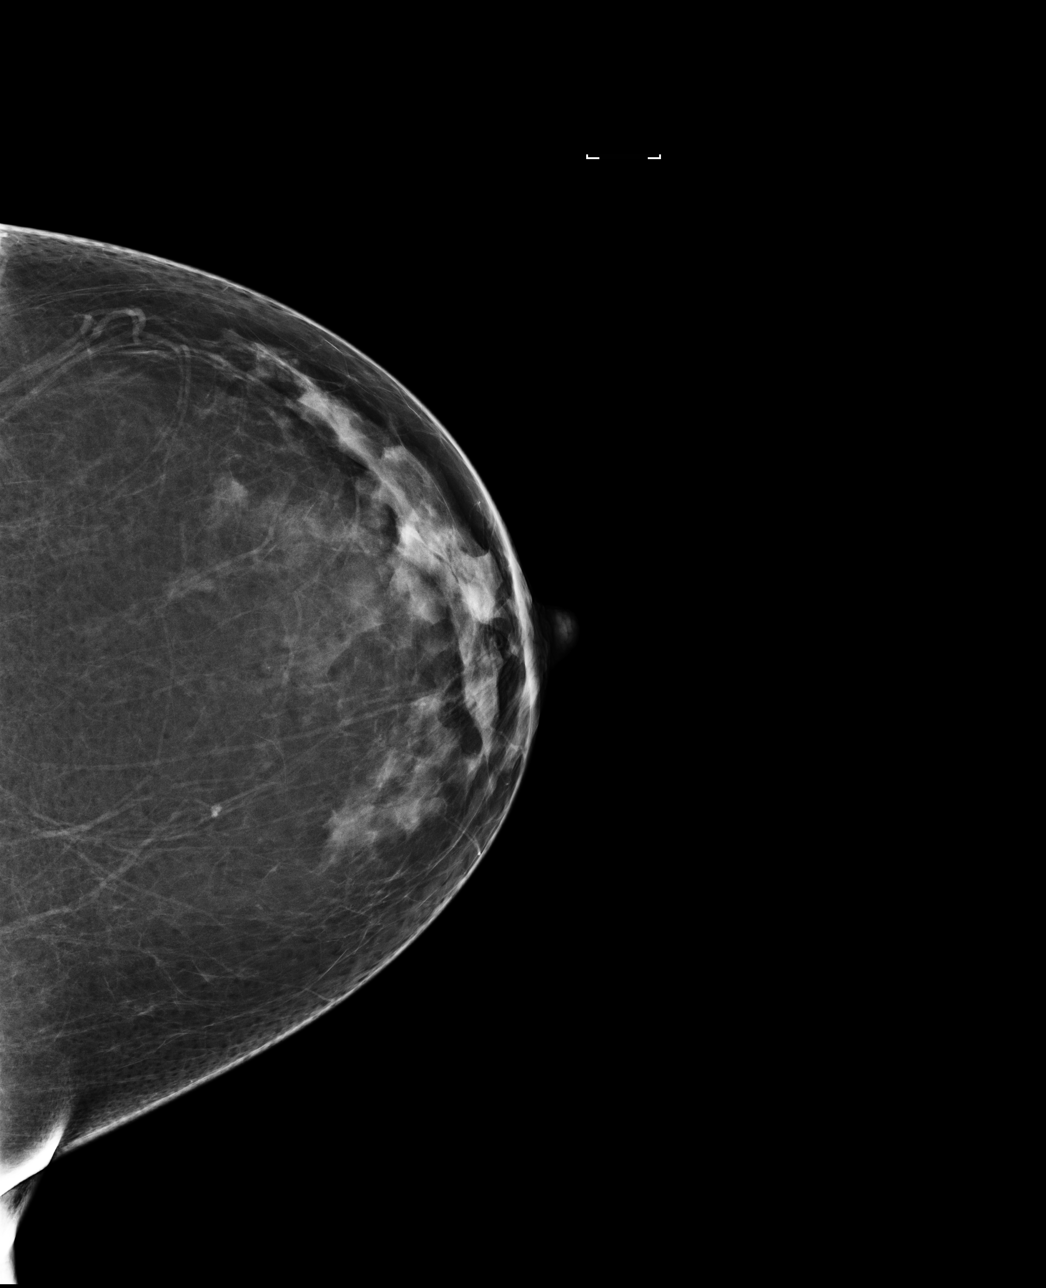

[R MLO]
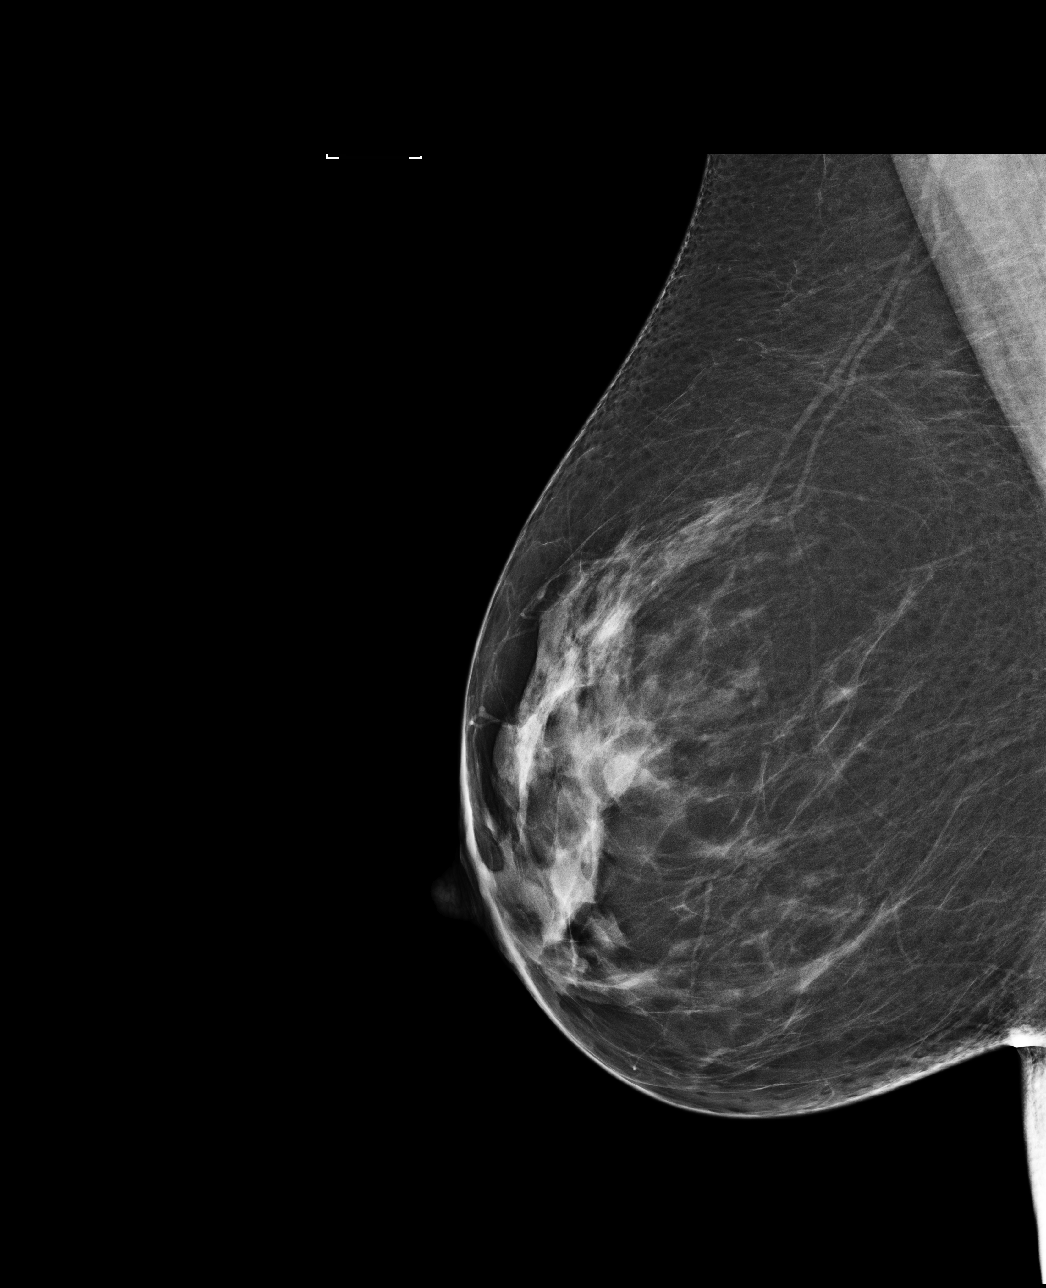

[4 of 4 positions shown; findings below may reference images not displayed]

ACR Breast Density Category b: There are scattered areas of
fibroglandular density.
FINDINGS: There are no findings suspicious for malignancy. Images were
processed with CAD.
IMPRESSION: No mammographic evidence of malignancy. A result letter of this
screening mammogram will be mailed directly to the patient.

RECOMMENDATION:
Screening mammogram in one year. (Code:AS-G-LCT)

BI-RADS CATEGORY  1: Negative.

## 2021-03-26 ENCOUNTER — Ambulatory Visit (INDEPENDENT_AMBULATORY_CARE_PROVIDER_SITE_OTHER): Payer: Managed Care, Other (non HMO) | Admitting: Physician Assistant

## 2021-03-26 ENCOUNTER — Other Ambulatory Visit: Payer: Self-pay

## 2021-03-26 ENCOUNTER — Encounter (INDEPENDENT_AMBULATORY_CARE_PROVIDER_SITE_OTHER): Payer: Self-pay | Admitting: Physician Assistant

## 2021-03-26 VITALS — BP 131/88 | HR 70 | Temp 98.6°F | Ht 67.0 in | Wt 214.0 lb

## 2021-03-26 DIAGNOSIS — E669 Obesity, unspecified: Secondary | ICD-10-CM

## 2021-03-26 DIAGNOSIS — E559 Vitamin D deficiency, unspecified: Secondary | ICD-10-CM | POA: Diagnosis not present

## 2021-03-26 DIAGNOSIS — Z6833 Body mass index (BMI) 33.0-33.9, adult: Secondary | ICD-10-CM

## 2021-04-03 NOTE — Progress Notes (Signed)
Chief Complaint:   OBESITY Linda Berger is here to discuss her progress with her obesity treatment plan along with follow-up of her obesity related diagnoses. Linda Berger is on the Category 2 Plan and states she is following her eating plan approximately 10% of the time. Linda Berger states she is doing 0 minutes 0 times per week.  Today's visit was #: 4 Starting weight: 212 lbs Starting date: 11/15/2020 Today's weight: 214 lbs Today's date: 03/26/2021 Total lbs lost to date: 0 Total lbs lost since last in-office visit: 0  Interim History: Linda Berger's last office visist was on 12/19/2020. She has not been following the plan and she is hungry throughout the day. She does not want to journal.  Subjective:   1. Vitamin D deficiency Linda Berger is on Vit D, and she denies nausea, vomiting, or muscle weakness.  Assessment/Plan:   1. Vitamin D deficiency Low Vitamin D level contributes to fatigue and are associated with obesity, breast, and colon cancer. Linda Berger will continue taking prescription Vitamin D 50,000 IU every week and will follow-up for routine testing of Vitamin D, at least 2-3 times per year to avoid over-replacement.  2. Class 1 obesity with serious comorbidity and body mass index (BMI) of 33.0 to 33.9 in adult, unspecified obesity type Linda Berger is currently in the action stage of change. As such, her goal is to continue with weight loss efforts. She has agreed to the Category 2 Plan.   Additional lunch options were given. Reginal Lutes was discussed. however the patient is not eating all of the food on the meal plan. We will revisit if needed.  Exercise goals: No exercise has been prescribed at this time.  Behavioral modification strategies: no skipping meals and meal planning and cooking strategies.  Linda Berger has agreed to follow-up with our clinic in 3 weeks. She was informed of the importance of frequent follow-up visits to maximize her success with intensive lifestyle modifications for her multiple health conditions.    Objective:   Blood pressure 131/88, pulse 70, temperature 98.6 F (37 C), height 5\' 7"  (1.702 m), weight 214 lb (97.1 kg), SpO2 97 %. Body mass index is 33.52 kg/m.  General: Cooperative, alert, well developed, in no acute distress. HEENT: Conjunctivae and lids unremarkable. Cardiovascular: Regular rhythm.  Lungs: Normal work of breathing. Neurologic: No focal deficits.   Lab Results  Component Value Date   CREATININE 0.75 11/15/2020   BUN 9 11/15/2020   NA 141 11/15/2020   K 4.4 11/15/2020   CL 107 (H) 11/15/2020   CO2 22 11/15/2020   Lab Results  Component Value Date   ALT 20 11/15/2020   AST 20 11/15/2020   ALKPHOS 71 11/15/2020   BILITOT 0.8 11/15/2020   Lab Results  Component Value Date   HGBA1C 5.5 11/15/2020   Lab Results  Component Value Date   INSULIN 5.6 11/15/2020   Lab Results  Component Value Date   TSH 0.47 01/10/2021   Lab Results  Component Value Date   CHOL 157 11/15/2020   HDL 52 11/15/2020   LDLCALC 80 11/15/2020   TRIG 141 11/15/2020   CHOLHDL 3.0 11/15/2020   Lab Results  Component Value Date   WBC 6.9 11/15/2020   HGB 14.3 11/15/2020   HCT 41.4 11/15/2020   MCV 87 11/15/2020   PLT 369 11/15/2020   No results found for: IRON, TIBC, FERRITIN  Attestation Statements:   Reviewed by clinician on day of visit: allergies, medications, problem list, medical history, surgical history, family  history, social history, and previous encounter notes.  Time spent on visit including pre-visit chart review and post-visit care and charting was 30 minutes.    Trude Mcburney, am acting as transcriptionist for [employer], PA-C.  I have reviewed the above documentation for accuracy and completeness, and I agree with the above. Alois Cliche, PA-C

## 2021-04-15 ENCOUNTER — Ambulatory Visit: Payer: Managed Care, Other (non HMO) | Admitting: Nurse Practitioner

## 2021-04-18 ENCOUNTER — Ambulatory Visit (INDEPENDENT_AMBULATORY_CARE_PROVIDER_SITE_OTHER): Payer: Managed Care, Other (non HMO) | Admitting: Physician Assistant

## 2021-04-19 ENCOUNTER — Ambulatory Visit
Admission: RE | Admit: 2021-04-19 | Discharge: 2021-04-19 | Disposition: A | Discharge status: Home / Self Care | Payer: Managed Care, Other (non HMO) | Source: Ambulatory Visit | Attending: Internal Medicine | Admitting: Internal Medicine

## 2021-04-19 DIAGNOSIS — Z1231 Encounter for screening mammogram for malignant neoplasm of breast: Secondary | ICD-10-CM

## 2021-05-07 NOTE — Progress Notes (Signed)
   Linda Berger 1966-04-11 [id number]   History:  55 y.o. G0 presents for annual exam without GYN complaints. Perimenopausal, LMP 06/2020, mild menopausal symptoms. 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 History of hypothyroidism, vitamin D deficiency.   Gynecologic History No LMP recorded. (Menstrual status: Perimenopausal).   Contraception/Family planning: none Sexually active: Yes  Health Maintenance Last Pap: 04/13/2019. Results were: Normal, 5-year repeat Last mammogram: 04/19/2021. Results were: Normal Last colonoscopy: 2019. Results were: Normal, 10-year recall Last Dexa: Not indicated  Past medical history, past surgical history, family history and social history were all reviewed and documented in the EPIC chart. 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   ROS:  A ROS was performed and pertinent positives and negatives are included.  Exam:  Vitals:   05/08/21 0756  BP: 122/78  Height: 5\' 6"  (1.676 m)   Body mass index is 34.54 kg/m.  General appearance:  Normal Thyroid:  Symmetrical, normal in size, without palpable masses or nodularity. Respiratory  Auscultation:  Clear without wheezing or rhonchi Cardiovascular  Auscultation:  Regular rate, without rubs, murmurs or gallops  Edema/varicosities:  Not grossly evident Abdominal  Soft,nontender, without masses, guarding or rebound.  Liver/spleen:  No organomegaly noted  Hernia:  None appreciated  Skin  Inspection:  Grossly normal Breasts: Examined lying and sitting.   Right: Without masses, retractions, nipple discharge or axillary adenopathy.   Left: Without masses, retractions, nipple discharge or axillary adenopathy. Genitourinary   Inguinal/mons:  Normal without inguinal adenopathy  External genitalia:  Normal appearing vulva with no masses, tenderness, or lesions  BUS/Urethra/Skene's glands:  Normal  Vagina:  Normal  appearing with normal color and discharge, no lesions  Cervix:  Normal appearing without discharge or lesions  Uterus:  Normal in size, shape and contour.  Midline and mobile, nontender  Adnexa/parametria:     Rt: Normal in size, without masses or tenderness.   Lt: Normal in size, without masses or tenderness.  Anus and perineum: Normal  Digital rectal exam: Normal sphincter tone without palpated masses or tenderness  Patient informed chaperone available to be present for breast and pelvic exam. Patient has requested no chaperone to be present. Patient has been advised what will be completed during breast and pelvic exam.  Assessment/Plan:  55 y.o. G0 for annual exam.   Well female exam with routine gynecological exam - Education provided on SBEs, importance of preventative screenings, current guidelines, high calcium diet, regular exercise, and multivitamin daily. Labs with PCP.   Perimenopausal - LMP 06/2020, mild menopausal symptoms.   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 Will repeat at 5-year interval per guidelines.  Screening for breast cancer - Normal mammogram history.  Continue annual screenings.  Normal breast exam today.  Screening for colon cancer - 2019 colonoscopy. Will repeat at GI's recommended interval.   Screening for osteoporosis - average risk. Will start screenings at age 65.   Return in 1 year for annual.   76 DNP, 8:09 AM 05/08/2021

## 2021-05-08 ENCOUNTER — Encounter: Payer: Self-pay | Admitting: Nurse Practitioner

## 2021-05-08 ENCOUNTER — Ambulatory Visit (INDEPENDENT_AMBULATORY_CARE_PROVIDER_SITE_OTHER): Payer: Managed Care, Other (non HMO) | Admitting: Nurse Practitioner

## 2021-05-08 ENCOUNTER — Other Ambulatory Visit: Payer: Self-pay

## 2021-05-08 VITALS — BP 122/78 | Ht 66.0 in

## 2021-05-08 DIAGNOSIS — Z01419 Encounter for gynecological examination (general) (routine) without abnormal findings: Secondary | ICD-10-CM | POA: Diagnosis not present

## 2021-05-08 DIAGNOSIS — N951 Menopausal and female climacteric states: Secondary | ICD-10-CM

## 2021-05-10 ENCOUNTER — Other Ambulatory Visit: Payer: Self-pay | Admitting: Internal Medicine

## 2021-05-10 DIAGNOSIS — E559 Vitamin D deficiency, unspecified: Secondary | ICD-10-CM

## 2021-05-14 ENCOUNTER — Encounter: Payer: Self-pay | Admitting: Internal Medicine

## 2021-05-14 DIAGNOSIS — E559 Vitamin D deficiency, unspecified: Secondary | ICD-10-CM

## 2021-05-15 MED ORDER — VITAMIN D (ERGOCALCIFEROL) 1.25 MG (50000 UNIT) PO CAPS: 50000.0000 [IU] | ORAL_CAPSULE | ORAL | 3 refills | Status: DC

## 2021-05-21 ENCOUNTER — Encounter: Payer: Self-pay | Admitting: Internal Medicine

## 2021-05-28 ENCOUNTER — Telehealth: Payer: Self-pay | Admitting: Internal Medicine

## 2021-05-28 DIAGNOSIS — R7989 Other specified abnormal findings of blood chemistry: Secondary | ICD-10-CM

## 2021-05-28 DIAGNOSIS — E559 Vitamin D deficiency, unspecified: Secondary | ICD-10-CM

## 2021-05-28 NOTE — Telephone Encounter (Signed)
Okay to fill 90 with 2 refills

## 2021-05-28 NOTE — Telephone Encounter (Signed)
Ok to refill Lexapro since it wasn't prescribed by you. Please advise

## 2021-05-28 NOTE — Telephone Encounter (Signed)
1.Medication Requested: escitalopram (LEXAPRO) 10 MG tablet  2. Pharmacy (Name, Street, City): [facility] - [place], [place] - [address]  Phone:  [phone] Fax:  [phone]   3. On Med List: yes  4. Last Visit with PCP: 03.24.22  5. Next visit date with PCP: n/a   Agent: Please be advised that RX refills may take up to 3 business days. We ask that you follow-up with your pharmacy.  

## 2021-05-28 NOTE — Telephone Encounter (Signed)
Refill request Vit D-2 cap and L-thyroxine

## 2021-06-03 ENCOUNTER — Telehealth: Payer: Self-pay | Admitting: Internal Medicine

## 2021-06-03 MED ORDER — ESCITALOPRAM OXALATE 10 MG PO TABS: 10.0000 mg | ORAL_TABLET | Freq: Every day | ORAL | 3 refills | Status: DC

## 2021-06-03 NOTE — Telephone Encounter (Signed)
Refill has been sent to the patient's pharmacy.  

## 2021-06-03 NOTE — Telephone Encounter (Signed)
1.Medication Requested: escitalopram (LEXAPRO) 10 MG tablet  2. Pharmacy (Name, Street, [place]): [facility] - [place], [place] - [address]  Phone:  [phone] Fax:  [phone]   3. On Med List: yes  4. Last Visit with PCP: 03.24.22  5. Next visit date with PCP: n/a   Agent: Please be advised that RX refills may take up to 3 business days. We ask that you follow-up with your pharmacy.

## 2021-06-12 MED ORDER — VITAMIN D (ERGOCALCIFEROL) 1.25 MG (50000 UNIT) PO CAPS: 50000.0000 [IU] | ORAL_CAPSULE | ORAL | 3 refills | Status: DC

## 2021-06-12 MED ORDER — LEVOTHYROXINE SODIUM 100 MCG PO TABS: 100.0000 ug | ORAL_TABLET | Freq: Every day | ORAL | 3 refills | Status: DC

## 2021-06-12 NOTE — Telephone Encounter (Signed)
[facility] called to see if we can transfer those refills for medications listed below.  Preferred pharmacy:   [facility] - [place], [place] - [address] Phone:  [phone]  Fax:  [phone]     Best callback #: [phone]

## 2021-06-12 NOTE — Telephone Encounter (Signed)
Medication has been sent to the patient's pharmacy.  

## 2021-08-20 ENCOUNTER — Encounter: Payer: Self-pay | Admitting: Internal Medicine

## 2021-08-20 ENCOUNTER — Ambulatory Visit (INDEPENDENT_AMBULATORY_CARE_PROVIDER_SITE_OTHER): Payer: Managed Care, Other (non HMO) | Admitting: Internal Medicine

## 2021-08-20 ENCOUNTER — Other Ambulatory Visit: Payer: Self-pay

## 2021-08-20 DIAGNOSIS — Z6835 Body mass index (BMI) 35.0-35.9, adult: Secondary | ICD-10-CM | POA: Diagnosis not present

## 2021-08-20 DIAGNOSIS — E6609 Other obesity due to excess calories: Secondary | ICD-10-CM | POA: Diagnosis not present

## 2021-08-20 MED ORDER — BUPROPION HCL ER (XL) 150 MG PO TB24: 150.0000 mg | ORAL_TABLET | Freq: Every day | ORAL | 1 refills | Status: DC

## 2021-08-20 MED ORDER — NALTREXONE HCL 50 MG PO TABS: 50.0000 mg | ORAL_TABLET | Freq: Every day | ORAL | 1 refills | Status: DC

## 2021-08-20 NOTE — Progress Notes (Signed)
   Subjective:   Patient ID: Linda Berger, female    DOB: 12/15/1965, 55 y.o.   MRN: [id number]  HPI The patient is a 55 YO female coming in for concerns about weight.   Review of Systems  Constitutional: Negative.   HENT: Negative.    Eyes: Negative.   Respiratory:  Negative for cough, chest tightness and shortness of breath.   Cardiovascular:  Negative for chest pain, palpitations and leg swelling.  Gastrointestinal:  Negative for abdominal distention, abdominal pain, constipation, diarrhea, nausea and vomiting.  Musculoskeletal: Negative.   Skin: Negative.   Neurological: Negative.   Psychiatric/Behavioral: Negative.     Objective:  Physical Exam Constitutional:      Appearance: She is well-developed. She is obese.  HENT:     Head: Normocephalic and atraumatic.  Cardiovascular:     Rate and Rhythm: Normal rate and regular rhythm.  Pulmonary:     Effort: Pulmonary effort is normal. No respiratory distress.     Breath sounds: Normal breath sounds. No wheezing or rales.  Abdominal:     General: Bowel sounds are normal. There is no distension.     Palpations: Abdomen is soft.     Tenderness: There is no abdominal tenderness. There is no rebound.  Musculoskeletal:     Cervical back: Normal range of motion.  Skin:    General: Skin is warm and dry.  Neurological:     Mental Status: She is alert and oriented to person, place, and time.     Coordination: Coordination normal.    Vitals:   08/20/21 0838  BP: 118/80  Pulse: 68  Resp: 18  SpO2: 98%  Weight: 221 lb (100.2 kg)  Height: 5\' 6"  (1.676 m)    This visit occurred during the SARS-CoV-2 public health emergency.  Safety protocols were in place, including screening questions prior to the visit, additional usage of staff PPE, and extensive cleaning of exam room while observing appropriate contact time as indicated for disinfecting solutions.   Assessment & Plan:  Visit time 25 minutes in face to face communication  with patient and coordination of care, additional 5 minutes spent in record review, coordination or care, ordering tests, communicating/referring to other healthcare professionals, documenting in medical records all on the same day of the visit for total time 30 minutes spent on the visit.

## 2021-08-20 NOTE — Patient Instructions (Addendum)
We have sent in wellbutrin and naltrexone which are both generic medicines to take 1 pill of the wellbutrin daily and start with 1/2 pill naltrexone for the first 2 weeks to help with hunger and cravings.

## 2021-08-20 NOTE — Assessment & Plan Note (Signed)
Rx wellbutrin 150 mg daily and naltrexone 25 mg daily for 2 weeks then increase to 50 mg daily to start for weight loss.

## 2021-09-25 ENCOUNTER — Telehealth: Payer: Managed Care, Other (non HMO) | Admitting: Emergency Medicine

## 2021-09-25 DIAGNOSIS — R051 Acute cough: Secondary | ICD-10-CM | POA: Diagnosis not present

## 2021-09-25 MED ORDER — BENZONATATE 100 MG PO CAPS: 100.0000 mg | ORAL_CAPSULE | Freq: Two times a day (BID) | ORAL | 0 refills | Status: DC | PRN

## 2021-09-25 MED ORDER — AZITHROMYCIN 250 MG PO TABS
ORAL_TABLET | ORAL | 0 refills | Status: DC
Start: 2021-09-25 — End: 2022-05-13

## 2021-09-25 NOTE — Progress Notes (Signed)
Virtual Visit Consent   [name], you are scheduled for a virtual visit with a [facility] provider today.     Just as with appointments in the office, your consent must be obtained to participate.  Your consent will be active for this visit and any virtual visit you may have with one of our providers in the next 365 days.     If you have a MyChart account, a copy of this consent can be sent to you electronically.  All virtual visits are billed to your insurance company just like a traditional visit in the office.    As this is a virtual visit, video technology does not allow for your provider to perform a traditional examination.  This may limit your provider's ability to fully assess your condition.  If your provider identifies any concerns that need to be evaluated in person or the need to arrange testing (such as labs, EKG, etc.), we will make arrangements to do so.     Although advances in technology are sophisticated, we cannot ensure that it will always work on either your end or our end.  If the connection with a video visit is poor, the visit may have to be switched to a telephone visit.  With either a video or telephone visit, we are not always able to ensure that we have a secure connection.     I need to obtain your verbal consent now.   Are you willing to proceed with your visit today?    BRIANNON BOGGIO has provided verbal consent on 09/25/2021 for a virtual visit video.   Roxy Horseman, PA-C   Date: 09/25/2021 8:24 AM   Virtual Visit via Video Note   I, Roxy Horseman, connected with  Linda Berger  ([id number], 01-25-1966) on 09/25/21 at  8:00 AM EST by a video-enabled telemedicine application and verified that I am speaking with the correct person using two identifiers.  Location: Patient: Virtual Visit Location Patient: Home Provider: Virtual Visit Location Provider: Home Office   I discussed the limitations of evaluation and management by telemedicine and the  availability of in person appointments. The patient expressed understanding and agreed to proceed.    History of Present Illness: Linda Berger is a 55 y.o. who identifies as a female who was assigned female at birth, and is being seen today for cough.  States that she has had cough for 7 days.  Denies fever or body aches.  States that she is having some productive cough.  Coughing keeps her from sleeping.  Reports mild sore throat.  HPI: HPI  Problems:  Patient Active Problem List   Diagnosis Date Noted   Right carpal tunnel syndrome 01/10/2021   Routine general medical examination at a health care facility 01/10/2021   At risk for osteoporosis 12/19/2020   At risk for diabetes mellitus 11/28/2020   Insulin resistance 11/28/2020   Other specified hypothyroidism 11/28/2020   Mood disorder (HCC) 11/15/2020   Vitamin D deficiency 11/15/2020   Other fatigue 11/15/2020   At risk for malnutrition 11/15/2020   Carpal tunnel syndrome of left wrist 06/02/2019   Obesity 12/25/2015    Allergies:  Allergies  Allergen Reactions   Aspirin Hives   Erythromycin Hives   Medications:  Current Outpatient Medications:    Acetaminophen (TYLENOL PO), Take by mouth., Disp: , Rfl:    buPROPion (WELLBUTRIN XL) 150 MG 24 hr tablet, Take 1 tablet (150 mg total) by mouth daily., Disp: 90  tablet, Rfl: 1   escitalopram (LEXAPRO) 10 MG tablet, Take 1 tablet (10 mg total) by mouth daily., Disp: 90 tablet, Rfl: 3   fluticasone (FLONASE) 50 MCG/ACT nasal spray, Place into both nostrils daily., Disp: , Rfl:    levothyroxine (SYNTHROID) 100 MCG tablet, Take 1 tablet (100 mcg total) by mouth daily before breakfast., Disp: 90 tablet, Rfl: 3   loratadine (CLARITIN) 10 MG tablet, Take 10 mg by mouth daily., Disp: , Rfl:    naltrexone (DEPADE) 50 MG tablet, Take 1 tablet (50 mg total) by mouth daily., Disp: 90 tablet, Rfl: 1   sodium chloride (OCEAN) 0.65 % SOLN nasal spray, Place 1 spray into both nostrils as needed  for congestion., Disp: , Rfl:    Vitamin D, Ergocalciferol, (DRISDOL) 1.25 MG (50000 UNIT) CAPS capsule, Take 1 capsule (50,000 Units total) by mouth every 7 (seven) days., Disp: 4 capsule, Rfl: 3  Observations/Objective: Patient is well-developed, well-nourished in no acute distress.  Resting comfortably at home.  Head is normocephalic, atraumatic.  No labored breathing.  Speech is clear and coherent with logical content.  Patient is alert and oriented at baseline.    Assessment and Plan: 1. Acute cough Azithro - patient allergic to Erythromycin, but states she can take a Z-pak.  Tessalon perles  Follow Up Instructions: I discussed the assessment and treatment plan with the patient. The patient was provided an opportunity to ask questions and all were answered. The patient agreed with the plan and demonstrated an understanding of the instructions.  A copy of instructions were sent to the patient via MyChart unless otherwise noted below.     The patient was advised to call back or seek an in-person evaluation if the symptoms worsen or if the condition fails to improve as anticipated.  Time:  I spent 13 minutes with the patient via telehealth technology discussing the above problems/concerns.    Roxy Horseman, PA-C

## 2021-10-19 ENCOUNTER — Other Ambulatory Visit: Payer: Self-pay | Admitting: Internal Medicine

## 2021-10-19 DIAGNOSIS — E559 Vitamin D deficiency, unspecified: Secondary | ICD-10-CM

## 2021-11-20 ENCOUNTER — Other Ambulatory Visit: Payer: Self-pay

## 2022-03-07 ENCOUNTER — Other Ambulatory Visit: Payer: Self-pay | Admitting: Nurse Practitioner

## 2022-03-07 DIAGNOSIS — Z1231 Encounter for screening mammogram for malignant neoplasm of breast: Secondary | ICD-10-CM

## 2022-04-24 ENCOUNTER — Ambulatory Visit
Admission: RE | Admit: 2022-04-24 | Discharge: 2022-04-24 | Disposition: A | Discharge status: Home / Self Care | Payer: Managed Care, Other (non HMO) | Source: Ambulatory Visit | Attending: Nurse Practitioner | Admitting: Nurse Practitioner

## 2022-04-24 DIAGNOSIS — Z1231 Encounter for screening mammogram for malignant neoplasm of breast: Secondary | ICD-10-CM

## 2022-05-13 ENCOUNTER — Ambulatory Visit (INDEPENDENT_AMBULATORY_CARE_PROVIDER_SITE_OTHER): Payer: Managed Care, Other (non HMO) | Admitting: Nurse Practitioner

## 2022-05-13 ENCOUNTER — Encounter: Payer: Self-pay | Admitting: Nurse Practitioner

## 2022-05-13 VITALS — BP 124/80 | HR 76 | Ht 66.5 in

## 2022-05-13 DIAGNOSIS — Z780 Asymptomatic menopausal state: Secondary | ICD-10-CM

## 2022-05-13 DIAGNOSIS — Z01419 Encounter for gynecological examination (general) (routine) without abnormal findings: Secondary | ICD-10-CM | POA: Diagnosis not present

## 2022-05-13 NOTE — Progress Notes (Signed)
   DEYCI GESELL 01-14-1966 [id number]   History:  56 y.o. G0 presents for annual exam without GYN complaints. Postmenopausal - no HRT, no bleeding. 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 History of hypothyroidism, vitamin D deficiency.   Gynecologic History Patient's last menstrual period was 10/26/2020.   Contraception/Family planning: none Sexually active: Yes  Health Maintenance Last Pap: 04/13/2019. Results were: Normal, 5-year repeat Last mammogram: 04/24/2022. Results were: Normal Last colonoscopy: 07/20/2018. Results were: Normal, 10-year recall Last Dexa: Not indicated  Past medical history, past surgical history, family history and social history were all reviewed and documented in the EPIC chart. 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   ROS:  A ROS was performed and pertinent positives and negatives are included.  Exam:  Vitals:   05/13/22 1503  BP: 124/80  Pulse: 76  SpO2: 98%  Height: 5' 6.5" (1.689 m)    Body mass index is 35.14 kg/m.  General appearance:  Normal Thyroid:  Symmetrical, normal in size, without palpable masses or nodularity. Respiratory  Auscultation:  Clear without wheezing or rhonchi Cardiovascular  Auscultation:  Regular rate, without rubs, murmurs or gallops  Edema/varicosities:  Not grossly evident Abdominal  Soft,nontender, without masses, guarding or rebound.  Liver/spleen:  No organomegaly noted  Hernia:  None appreciated  Skin  Inspection:  Grossly normal Breasts: Examined lying and sitting.   Right: Without masses, retractions, nipple discharge or axillary adenopathy.   Left: Without masses, retractions, nipple discharge or axillary adenopathy. Genitourinary   Inguinal/mons:  Normal without inguinal adenopathy  External genitalia:  Normal appearing vulva with no masses, tenderness, or lesions  BUS/Urethra/Skene's glands:   Normal  Vagina:  Normal appearing with normal color and discharge, no lesions  Cervix:  Normal appearing without discharge or lesions  Uterus:  Normal in size, shape and contour.  Midline and mobile, nontender  Adnexa/parametria:     Rt: Normal in size, without masses or tenderness.   Lt: Normal in size, without masses or tenderness.  Anus and perineum: Normal  Digital rectal exam: Normal sphincter tone without palpated masses or tenderness  Patient informed chaperone available to be present for breast and pelvic exam. Patient has requested no chaperone to be present. Patient has been advised what will be completed during breast and pelvic exam.  Assessment/Plan:  56 y.o. G0 for annual exam.   Well female exam with routine gynecological exam - Education provided on SBEs, importance of preventative screenings, current guidelines, high calcium diet, regular exercise, and multivitamin daily. Labs with PCP.   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 Will repeat at 5-year interval per guidelines.  Screening for breast cancer - Normal mammogram history.  Continue annual screenings.  Normal breast exam today.  Screening for colon cancer - 2019 colonoscopy. Will repeat at GI's recommended interval.   Screening for osteoporosis - average risk. Will start screenings at age 56.   Return in 1 year for annual.      [name], 3:27 PM 05/13/2022

## 2022-05-28 ENCOUNTER — Encounter (INDEPENDENT_AMBULATORY_CARE_PROVIDER_SITE_OTHER): Payer: Self-pay

## 2022-07-05 ENCOUNTER — Other Ambulatory Visit: Payer: Self-pay | Admitting: Internal Medicine

## 2022-08-26 ENCOUNTER — Encounter: Payer: Self-pay | Admitting: Internal Medicine

## 2022-08-26 ENCOUNTER — Ambulatory Visit: Payer: Managed Care, Other (non HMO) | Admitting: Internal Medicine

## 2022-08-26 VITALS — BP 120/80 | HR 82 | Temp 98.3°F | Ht 66.5 in | Wt 205.0 lb

## 2022-08-26 DIAGNOSIS — Z0000 Encounter for general adult medical examination without abnormal findings: Secondary | ICD-10-CM

## 2022-08-26 DIAGNOSIS — E038 Other specified hypothyroidism: Secondary | ICD-10-CM | POA: Diagnosis not present

## 2022-08-26 DIAGNOSIS — E88819 Insulin resistance, unspecified: Secondary | ICD-10-CM | POA: Diagnosis not present

## 2022-08-26 DIAGNOSIS — Z23 Encounter for immunization: Secondary | ICD-10-CM

## 2022-08-26 DIAGNOSIS — E559 Vitamin D deficiency, unspecified: Secondary | ICD-10-CM

## 2022-08-26 DIAGNOSIS — F39 Unspecified mood [affective] disorder: Secondary | ICD-10-CM

## 2022-08-26 LAB — COMPREHENSIVE METABOLIC PANEL
ALT: 20 U/L (ref 0–35)
AST: 17 U/L (ref 0–37)
Albumin: 4.3 g/dL (ref 3.5–5.2)
Alkaline Phosphatase: 53 U/L (ref 39–117)
BUN: 16 mg/dL (ref 6–23)
CO2: 28 mEq/L (ref 19–32)
Calcium: 9.4 mg/dL (ref 8.4–10.5)
Chloride: 105 mEq/L (ref 96–112)
Creatinine, Ser: 0.72 mg/dL (ref 0.40–1.20)
GFR: 94.73 mL/min (ref >60.00)
Glucose, Bld: 70 mg/dL (ref 70–99)
Potassium: 4 mEq/L (ref 3.5–5.1)
Sodium: 138 mEq/L (ref 135–145)
Total Bilirubin: 0.6 mg/dL (ref 0.2–1.2)
Total Protein: 7.2 g/dL (ref 6.0–8.3)

## 2022-08-26 LAB — LIPID PANEL
Cholesterol: 145 mg/dL (ref 0–200)
HDL: 68.9 mg/dL (ref >39.00)
LDL Cholesterol: 58 mg/dL (ref 0–99)
NonHDL: 76.25
Total CHOL/HDL Ratio: 2
Triglycerides: 90 mg/dL (ref 0.0–149.0)
VLDL: 18 mg/dL (ref 0.0–40.0)

## 2022-08-26 LAB — CBC
HCT: 39.5 % (ref 36.0–46.0)
Hemoglobin: 13.3 g/dL (ref 12.0–15.0)
MCHC: 33.8 g/dL (ref 30.0–36.0)
MCV: 87.4 fl (ref 78.0–100.0)
Platelets: 327 10*3/uL (ref 150.0–400.0)
RBC: 4.52 Mil/uL (ref 3.87–5.11)
RDW: 13.5 % (ref 11.5–15.5)
WBC: 6.5 10*3/uL (ref 4.0–10.5)

## 2022-08-26 LAB — VITAMIN D 25 HYDROXY (VIT D DEFICIENCY, FRACTURES): VITD: 58.49 ng/mL (ref 30.00–100.00)

## 2022-08-26 LAB — T4, FREE: Free T4: 0.92 ng/dL (ref 0.60–1.60)

## 2022-08-26 LAB — HEMOGLOBIN A1C: Hgb A1c MFr Bld: 5.6 % (ref 4.6–6.5)

## 2022-08-26 LAB — TSH: TSH: 1.22 u[IU]/mL (ref 0.35–5.50)

## 2022-08-26 MED ORDER — LORATADINE 10 MG PO TABS: 10.0000 mg | ORAL_TABLET | Freq: Every day | ORAL | 3 refills | Status: AC

## 2022-08-26 MED ORDER — FLUTICASONE PROPIONATE 50 MCG/ACT NA SUSP: 2.0000 | Freq: Every day | NASAL | 3 refills | Status: AC

## 2022-08-26 MED ORDER — VITAMIN D (ERGOCALCIFEROL) 1.25 MG (50000 UNIT) PO CAPS: 50000.0000 [IU] | ORAL_CAPSULE | ORAL | 3 refills | Status: DC

## 2022-08-26 MED ORDER — ESCITALOPRAM OXALATE 10 MG PO TABS: 10.0000 mg | ORAL_TABLET | Freq: Every day | ORAL | 3 refills | Status: DC

## 2022-08-26 NOTE — Progress Notes (Signed)
   Subjective:   Patient ID: Linda Berger, female    DOB: June 20, 1966, 56 y.o.   MRN: [id number]  HPI The patient is here for physical.  PMH, [facility], social history reviewed and updated  Review of Systems  Constitutional: Negative.   HENT: Negative.    Eyes: Negative.   Respiratory:  Negative for cough, chest tightness and shortness of breath.   Cardiovascular:  Negative for chest pain, palpitations and leg swelling.  Gastrointestinal:  Negative for abdominal distention, abdominal pain, constipation, diarrhea, nausea and vomiting.  Musculoskeletal: Negative.   Skin: Negative.   Neurological: Negative.   Psychiatric/Behavioral: Negative.      Objective:  Physical Exam Constitutional:      Appearance: She is well-developed.  HENT:     Head: Normocephalic and atraumatic.  Cardiovascular:     Rate and Rhythm: Normal rate and regular rhythm.  Pulmonary:     Effort: Pulmonary effort is normal. No respiratory distress.     Breath sounds: Normal breath sounds. No wheezing or rales.  Abdominal:     General: Bowel sounds are normal. There is no distension.     Palpations: Abdomen is soft.     Tenderness: There is no abdominal tenderness. There is no rebound.  Musculoskeletal:     Cervical back: Normal range of motion.  Skin:    General: Skin is warm and dry.  Neurological:     Mental Status: She is alert and oriented to person, place, and time.     Coordination: Coordination normal.     Vitals:   08/26/22 0755  BP: 120/80  Pulse: 82  Temp: 98.3 F (36.8 C)  TempSrc: Oral  SpO2: 96%  Weight: 205 lb (93 kg)  Height: 5' 6.5" (1.689 m)    Assessment & Plan:  Tdap given at visit

## 2022-08-26 NOTE — Assessment & Plan Note (Signed)
Checking TSH and free T4 and adjust synthroid 100 mcg daily. She is having more fatigue, dry skin, brittle hair lately.

## 2022-08-26 NOTE — Assessment & Plan Note (Signed)
Flu shot up to date. Covid-19 counseled. Shingrix complete. Tetanus given. Colonoscopy up to date. Mammogram up to date, pap smear up to date. Counseled about sun safety and mole surveillance. Counseled about the dangers of distracted driving. Given 10 year screening recommendations.

## 2022-08-26 NOTE — Assessment & Plan Note (Signed)
Checking lipid panel and Hga1c. Encouraged exercise.

## 2022-08-26 NOTE — Assessment & Plan Note (Signed)
Stable on lexapro 10 mg daily. More stress with parent caregiving. Advised to add exercise or meditation to help.Refilled lexapro 10 mg daily and will continue.

## 2022-08-26 NOTE — Assessment & Plan Note (Signed)
Checking vitamin D level and refilled 50000 units vitamin D weekly. Adjust as needed.

## 2022-08-28 MED ORDER — LEVOTHYROXINE SODIUM 112 MCG PO TABS: 112.0000 ug | ORAL_TABLET | Freq: Every day | ORAL | 3 refills | Status: DC

## 2022-10-05 ENCOUNTER — Other Ambulatory Visit: Payer: Self-pay | Admitting: Internal Medicine

## 2022-10-05 DIAGNOSIS — E559 Vitamin D deficiency, unspecified: Secondary | ICD-10-CM

## 2022-11-03 ENCOUNTER — Other Ambulatory Visit (INDEPENDENT_AMBULATORY_CARE_PROVIDER_SITE_OTHER): Payer: Managed Care, Other (non HMO)

## 2022-11-03 DIAGNOSIS — E038 Other specified hypothyroidism: Secondary | ICD-10-CM | POA: Diagnosis not present

## 2022-11-03 LAB — TSH: TSH: 0.11 u[IU]/mL — ABNORMAL LOW (ref 0.35–5.50)

## 2022-11-03 LAB — T4, FREE: Free T4: 1.09 ng/dL (ref 0.60–1.60)

## 2022-11-05 ENCOUNTER — Encounter: Payer: Self-pay | Admitting: Internal Medicine

## 2022-11-12 ENCOUNTER — Other Ambulatory Visit: Payer: Self-pay

## 2022-11-12 MED ORDER — LEVOTHYROXINE SODIUM 112 MCG PO TABS: 112.0000 ug | ORAL_TABLET | Freq: Every day | ORAL | 3 refills | Status: DC

## 2022-11-12 NOTE — Telephone Encounter (Signed)
Patient called back concerning this message.  It was sent on 11/05/2022 and was never responded to.  Please reach out to patient.  Patient's Number:  [phone]

## 2022-11-17 ENCOUNTER — Telehealth: Payer: Self-pay | Admitting: Internal Medicine

## 2022-11-17 MED ORDER — ONDANSETRON HCL 4 MG PO TABS: 4.0000 mg | ORAL_TABLET | Freq: Three times a day (TID) | ORAL | 0 refills | Status: DC | PRN

## 2022-11-17 NOTE — Telephone Encounter (Signed)
Patient husband called wanting to know if a prescription could be sent in for the patient, she has been throwing up nonstop.

## 2022-11-17 NOTE — Telephone Encounter (Signed)
Sent in zofran for her. IF she is having abdominal pain along with vomiting I recommend urgent care visit. Otherwise okay to try and be able to get fluids down.

## 2022-11-17 NOTE — Telephone Encounter (Signed)
Called patient and left a voicemail 

## 2022-12-16 ENCOUNTER — Telehealth: Payer: Managed Care, Other (non HMO) | Admitting: Physician Assistant

## 2022-12-16 DIAGNOSIS — L0291 Cutaneous abscess, unspecified: Secondary | ICD-10-CM | POA: Diagnosis not present

## 2022-12-16 MED ORDER — CEPHALEXIN 500 MG PO CAPS: 500.0000 mg | ORAL_CAPSULE | Freq: Four times a day (QID) | ORAL | 0 refills | Status: AC

## 2022-12-16 NOTE — Progress Notes (Signed)
E Visit for Cellulitis  We are sorry that you are not feeling well. Here is how we plan to help!  Based on what you shared with me it looks like you have cellulitis.  Cellulitis looks like areas of skin redness, swelling, and warmth; it develops as a result of bacteria entering under the skin. Little red spots and/or bleeding can be seen in skin, and tiny surface sacs containing fluid can occur. Fever can be present. Cellulitis is almost always on one side of a body, and the lower limbs are the most common site of involvement.   If there is no improvement in 1-2 days this may need to be drained.  This can be done in Urgent Care of with your PCP.  Apply warm compress several times per day.   I have prescribed:  Keflex '500mg'$  take one by mouth four times a day for 5 days  HOME CARE:  Take your medications as ordered and take all of them, even if the skin irritation appears to be healing.   GET HELP RIGHT AWAY IF:  Symptoms that don't begin to go away within 48 hours. Severe redness persists or worsens If the area turns color, spreads or swells. If it blisters and opens, develops yellow-brown crust or bleeds. You develop a fever or chills. If the pain increases or becomes unbearable.  Are unable to keep fluids and food down.  MAKE SURE YOU   Understand these instructions. Will watch your condition. Will get help right away if you are not doing well or get worse.  Thank you for choosing an e-visit.  Your e-visit answers were reviewed by a board certified advanced clinical practitioner to complete your personal care plan. Depending upon the condition, your plan could have included both over the counter or prescription medications.  Please review your pharmacy choice. Make sure the pharmacy is open so you can pick up prescription now. If there is a problem, you may contact your provider through [employer] and have the prescription routed to another pharmacy.  Your safety is  important to [place]. If you have drug allergies check your prescription carefully.   For the next 24 hours you can use MyChart to ask questions about today's visit, request a non-urgent call back, or ask for a work or school excuse. You will get an email in the next two days asking about your experience. I hope that your e-visit has been valuable and will speed your recovery.   I have spent 5 minutes in review of e-visit questionnaire, review and updating patient chart, medical decision making and response to patient.   Lenise Arena Ward, PA-C

## 2023-03-11 ENCOUNTER — Other Ambulatory Visit: Payer: Self-pay | Admitting: Nurse Practitioner

## 2023-03-11 DIAGNOSIS — Z1231 Encounter for screening mammogram for malignant neoplasm of breast: Secondary | ICD-10-CM

## 2023-04-27 ENCOUNTER — Ambulatory Visit
Admission: RE | Admit: 2023-04-27 | Discharge: 2023-04-27 | Disposition: A | Discharge status: Home / Self Care | Payer: Managed Care, Other (non HMO) | Source: Ambulatory Visit | Attending: Nurse Practitioner

## 2023-04-27 DIAGNOSIS — Z1231 Encounter for screening mammogram for malignant neoplasm of breast: Secondary | ICD-10-CM

## 2023-06-01 NOTE — Progress Notes (Unsigned)
   Linda Berger [birth year] [id number]   History:  57 y.o. G0 presents for annual exam without GYN complaints. Postmenopausal - no HRT, no bleeding. Occasional hot flashes. 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 History of hypothyroidism, vitamin D deficiency.   Gynecologic History Patient's last menstrual period was 10/26/2020.   Contraception/Family planning: post menopausal status Sexually active: Yes  Health Maintenance Last Pap: 04/13/2019. Results were: Normal neg HPV, 5-year repeat Last mammogram: 04/27/2023. Results were: Normal Last colonoscopy: 07/20/2018. Results were: Normal, 10-year recall Last Dexa: Not indicated  Past medical history, past surgical history, family history and social history were all reviewed and documented in the EPIC chart. 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   ROS:  A ROS was performed and pertinent positives and negatives are included.  Exam:  Vitals:   06/02/23 0923  BP: 118/78  Pulse: 71  Resp: 16  SpO2: 97%  Height: 5' 6.75" (1.695 m)     Body mass index is 32.35 kg/m.  General appearance:  Normal Thyroid:  Symmetrical, normal in size, without palpable masses or nodularity. Respiratory  Auscultation:  Clear without wheezing or rhonchi Cardiovascular  Auscultation:  Regular rate, without rubs, murmurs or gallops  Edema/varicosities:  Not grossly evident Abdominal  Soft,nontender, without masses, guarding or rebound.  Liver/spleen:  No organomegaly noted  Hernia:  None appreciated  Skin  Inspection:  Grossly normal Breasts: Examined lying and sitting.   Right: Without masses, retractions, nipple discharge or axillary adenopathy.   Left: Without masses, retractions, nipple discharge or axillary adenopathy. Genitourinary   Inguinal/mons:  Normal without inguinal adenopathy  External genitalia:  Normal appearing vulva with no masses,  tenderness, or lesions  BUS/Urethra/Skene's glands:  Normal  Vagina:  Normal appearing with normal color and discharge, no lesions  Cervix:  Normal appearing without discharge or lesions  Uterus:  Normal in size, shape and contour.  Midline and mobile, nontender  Adnexa/parametria:     Rt: Normal in size, without masses or tenderness.   Lt: Normal in size, without masses or tenderness.  Anus and perineum: Normal  Digital rectal exam: Not indicated  Patient informed chaperone available to be present for breast and pelvic exam. Patient has requested no chaperone to be present. Patient has been advised what will be completed during breast and pelvic exam.  Assessment/Plan:  57 y.o. G0 for annual exam.   Well female exam with routine gynecological exam - Education provided on SBEs, importance of preventative screenings, current guidelines, high calcium diet, regular exercise, and multivitamin daily. Labs with PCP.   Postmenopausal - no HRT, no bleeding. Occasional hot flashes.   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 Will repeat at 5-year interval per guidelines.  Screening for breast cancer - Normal mammogram history.  Continue annual screenings.  Normal breast exam today.  Screening for colon cancer - 2019 colonoscopy. Will repeat at GI's recommended interval.   Screening for osteoporosis - Average risk. Will start screenings at age 50.   Return in 1 year for annual.      Linda Mackie DNP, 9:47 AM 06/02/2023

## 2023-06-02 ENCOUNTER — Ambulatory Visit (INDEPENDENT_AMBULATORY_CARE_PROVIDER_SITE_OTHER): Payer: Managed Care, Other (non HMO) | Admitting: Nurse Practitioner

## 2023-06-02 ENCOUNTER — Encounter: Payer: Self-pay | Admitting: Nurse Practitioner

## 2023-06-02 VITALS — BP 118/78 | HR 71 | Resp 16 | Ht 66.75 in

## 2023-06-02 DIAGNOSIS — Z780 Asymptomatic menopausal state: Secondary | ICD-10-CM

## 2023-06-02 DIAGNOSIS — Z01419 Encounter for gynecological examination (general) (routine) without abnormal findings: Secondary | ICD-10-CM

## 2023-07-11 ENCOUNTER — Other Ambulatory Visit: Payer: Self-pay | Admitting: Internal Medicine

## 2023-08-12 ENCOUNTER — Telehealth: Payer: Managed Care, Other (non HMO) | Admitting: Physician Assistant

## 2023-08-12 DIAGNOSIS — L723 Sebaceous cyst: Secondary | ICD-10-CM | POA: Diagnosis not present

## 2023-08-12 DIAGNOSIS — L089 Local infection of the skin and subcutaneous tissue, unspecified: Secondary | ICD-10-CM | POA: Diagnosis not present

## 2023-08-12 MED ORDER — CEPHALEXIN 500 MG PO CAPS: 500.0000 mg | ORAL_CAPSULE | Freq: Four times a day (QID) | ORAL | 0 refills | Status: DC

## 2023-08-12 NOTE — Progress Notes (Signed)
E-Visit for Cellulitis/Infected Sebaceous Cyst  We are sorry that you are not feeling well. Here is how we plan to help!  Based on what you shared with me it looks like you have cellulitis.  Cellulitis looks like areas of skin redness, swelling, and warmth; it develops as a result of bacteria entering under the skin. Little red spots and/or bleeding can be seen in skin, and tiny surface sacs containing fluid can occur. Fever can be present. Cellulitis is almost always on one side of a body, and the lower limbs are the most common site of involvement.   I have prescribed:  Keflex 500mg  take one by mouth four times a day for 5 days  HOME CARE:  Take your medications as ordered and take all of them, even if the skin irritation appears to be healing.   GET HELP RIGHT AWAY IF:  Symptoms that don't begin to go away within 48 hours. Severe redness persists or worsens If the area turns color, spreads or swells. If it blisters and opens, develops yellow-brown crust or bleeds. You develop a fever or chills. If the pain increases or becomes unbearable.  Are unable to keep fluids and food down.  MAKE SURE YOU   Understand these instructions. Will watch your condition. Will get help right away if you are not doing well or get worse.  Thank you for choosing an e-visit.  Your e-visit answers were reviewed by a board certified advanced clinical practitioner to complete your personal care plan. Depending upon the condition, your plan could have included both over the counter or prescription medications.  Please review your pharmacy choice. Make sure the pharmacy is open so you can pick up prescription now. If there is a problem, you may contact your provider through [employer] and have the prescription routed to another pharmacy.  Your safety is important to [place]. If you have drug allergies check your prescription carefully.   For the next 24 hours you can use MyChart to ask questions about  today's visit, request a non-urgent call back, or ask for a work or school excuse. You will get an email in the next two days asking about your experience. I hope that your e-visit has been valuable and will speed your recovery.   I have spent 5 minutes in review of e-visit questionnaire, review and updating patient chart, medical decision making and response to patient.   Margaretann Loveless, PA-C

## 2023-09-05 ENCOUNTER — Encounter: Payer: Self-pay | Admitting: Internal Medicine

## 2023-09-08 ENCOUNTER — Ambulatory Visit: Payer: Managed Care, Other (non HMO) | Admitting: Internal Medicine

## 2023-09-08 ENCOUNTER — Encounter: Payer: Self-pay | Admitting: Internal Medicine

## 2023-09-08 VITALS — BP 112/88 | HR 67 | Temp 98.3°F | Ht 66.75 in

## 2023-09-08 DIAGNOSIS — L02416 Cutaneous abscess of left lower limb: Secondary | ICD-10-CM | POA: Diagnosis not present

## 2023-09-08 MED ORDER — SULFAMETHOXAZOLE-TRIMETHOPRIM 800-160 MG PO TABS: 1.0000 | ORAL_TABLET | Freq: Two times a day (BID) | ORAL | 0 refills | Status: AC

## 2023-09-08 NOTE — Assessment & Plan Note (Signed)
Rx bactrim 1 week to treat as keflex partially treated.

## 2023-09-08 NOTE — Progress Notes (Signed)
   Subjective:   Patient ID: Linda Berger, female    DOB: Jan 06, 1966, 57 y.o.   MRN: [id number]  HPI The patient is a 57 YO female coming in for cyst on the left thigh area. Painful and swollen. Started 2-3 weeks ago and did e-visit prescribed keflex 500 mg QID for 5 days. This did help most of the area but part stayed and then got worse.   Review of Systems  Constitutional: Negative.   HENT: Negative.    Eyes: Negative.   Respiratory:  Negative for cough, chest tightness and shortness of breath.   Cardiovascular:  Negative for chest pain, palpitations and leg swelling.  Gastrointestinal:  Negative for abdominal distention, abdominal pain, constipation, diarrhea, nausea and vomiting.  Musculoskeletal: Negative.   Skin: Negative.        Cyst left thigh  Neurological: Negative.   Psychiatric/Behavioral: Negative.      Objective:  Physical Exam Constitutional:      Appearance: She is well-developed.  HENT:     Head: Normocephalic and atraumatic.  Cardiovascular:     Rate and Rhythm: Normal rate and regular rhythm.  Pulmonary:     Effort: Pulmonary effort is normal. No respiratory distress.     Breath sounds: Normal breath sounds. No wheezing or rales.  Abdominal:     General: Bowel sounds are normal. There is no distension.     Palpations: Abdomen is soft.     Tenderness: There is no abdominal tenderness. There is no rebound.  Musculoskeletal:     Cervical back: Normal range of motion.  Skin:    General: Skin is warm and dry.     Comments: Abscess left upper thigh without fluctuance no surrounding cellulitis  Neurological:     Mental Status: She is alert and oriented to person, place, and time.     Coordination: Coordination normal.     Vitals:   09/08/23 1455  BP: 112/88  Pulse: 67  Temp: 98.3 F (36.8 C)  TempSrc: Oral  SpO2: 97%  Height: 5' 6.75" (1.695 m)    Assessment & Plan:

## 2023-09-19 ENCOUNTER — Other Ambulatory Visit: Payer: Self-pay | Admitting: Internal Medicine

## 2023-09-19 DIAGNOSIS — E559 Vitamin D deficiency, unspecified: Secondary | ICD-10-CM

## 2023-10-15 ENCOUNTER — Telehealth: Payer: Managed Care, Other (non HMO) | Admitting: Physician Assistant

## 2023-10-15 DIAGNOSIS — J208 Acute bronchitis due to other specified organisms: Secondary | ICD-10-CM

## 2023-10-15 DIAGNOSIS — B9689 Other specified bacterial agents as the cause of diseases classified elsewhere: Secondary | ICD-10-CM

## 2023-10-15 MED ORDER — BENZONATATE 100 MG PO CAPS: 100.0000 mg | ORAL_CAPSULE | Freq: Three times a day (TID) | ORAL | 0 refills | Status: DC | PRN

## 2023-10-15 MED ORDER — DOXYCYCLINE HYCLATE 100 MG PO TABS: 100.0000 mg | ORAL_TABLET | Freq: Two times a day (BID) | ORAL | 0 refills | Status: DC

## 2023-10-15 NOTE — Progress Notes (Signed)
E-Visit for Cough   We are sorry that you are not feeling well.  Here is how we plan to help!  Based on your presentation I believe you most likely have A cough due to bacteria.  When patients have a fever and a productive cough with a change in color or increased sputum production, we are concerned about bacterial bronchitis.  If left untreated it can progress to pneumonia.  If your symptoms do not improve with your treatment plan it is important that you contact your provider.   I have prescribed Doxycycline 100 mg twice a day for 7 days     In addition you may use A non-prescription cough medication called Mucinex DM: take 2 tablets every 12 hours. and A prescription cough medication called Tessalon Perles 100mg . You may take 1-2 capsules every 8 hours as needed for your cough.  From your responses in the eVisit questionnaire you describe inflammation in the upper respiratory tract which is causing a significant cough.  This is commonly called Bronchitis and has four common causes:   Allergies Viral Infections Acid Reflux Bacterial Infection Allergies, viruses and acid reflux are treated by controlling symptoms or eliminating the cause. An example might be a cough caused by taking certain blood pressure medications. You stop the cough by changing the medication. Another example might be a cough caused by acid reflux. Controlling the reflux helps control the cough.     HOME CARE Only take medications as instructed by your medical team. Complete the entire course of an antibiotic. Drink plenty of fluids and get plenty of rest. Avoid close contacts especially the very young and the elderly Cover your mouth if you cough or cough into your sleeve. Always remember to wash your hands A steam or ultrasonic humidifier can help congestion.   GET HELP RIGHT AWAY IF: You develop worsening fever. You become short of breath You cough up blood. Your symptoms persist after you have completed your  treatment plan MAKE SURE YOU  Understand these instructions. Will watch your condition. Will get help right away if you are not doing well or get worse.    Thank you for choosing an e-visit.  Your e-visit answers were reviewed by a board certified advanced clinical practitioner to complete your personal care plan. Depending upon the condition, your plan could have included both over the counter or prescription medications.  Please review your pharmacy choice. Make sure the pharmacy is open so you can pick up prescription now. If there is a problem, you may contact your provider through [employer] and have the prescription routed to another pharmacy.  Your safety is important to [place]. If you have drug allergies check your prescription carefully.   For the next 24 hours you can use MyChart to ask questions about today's visit, request a non-urgent call back, or ask for a work or school excuse. You will get an email in the next two days asking about your experience. I hope that your e-visit has been valuable and will speed your recovery.   I have spent 5 minutes in review of e-visit questionnaire, review and updating patient chart, medical decision making and response to patient.   Margaretann Loveless, PA-C

## 2023-10-28 ENCOUNTER — Encounter: Payer: Self-pay | Admitting: Internal Medicine

## 2023-10-28 ENCOUNTER — Other Ambulatory Visit: Payer: Self-pay

## 2023-10-28 DIAGNOSIS — E559 Vitamin D deficiency, unspecified: Secondary | ICD-10-CM

## 2023-10-28 MED ORDER — LEVOTHYROXINE SODIUM 112 MCG PO TABS: 112.0000 ug | ORAL_TABLET | Freq: Every day | ORAL | 0 refills | Status: DC

## 2023-10-28 MED ORDER — ESCITALOPRAM OXALATE 10 MG PO TABS: 10.0000 mg | ORAL_TABLET | Freq: Every day | ORAL | 0 refills | Status: DC

## 2023-10-28 NOTE — Telephone Encounter (Signed)
 Please send in refill for vitamin D

## 2023-11-01 ENCOUNTER — Telehealth: Payer: 59 | Admitting: Family Medicine

## 2023-11-01 DIAGNOSIS — J329 Chronic sinusitis, unspecified: Secondary | ICD-10-CM

## 2023-11-01 NOTE — Progress Notes (Signed)
 Because Linda Berger, I feel your condition warrants further evaluation and I recommend that you be seen in a face to face visit.   NOTE: There will be NO CHARGE for this E-Visit   If you are having a true medical emergency please call 911.     For an urgent face to face visit, [facility] has multiple urgent care centers for your convenience.   [facility] at [facility] Your location to [facility] Urgent Care at [facility] [phone] [address] [place], [place] [address]   Urgent Care at [facility] [place], [place]  [place] Your location to [facility] Urgent Care at [facility] [phone] [place],  [place]  [address]  [facility] Urgent Care at [facility]) Get Driving Directions [phone] [address], [address] [place], [address]    [facility] Urgent Care Center at [facility] Get Driving Directions [phone] [address] [place], [place] [address]   [facility] Urgent [facility]) Get Driving Directions [phone] [address] [place], [place] [address]  [facility] Urgent Care Center [facility]) Get Driving Directions [phone] [address] [place],  [place]  [address]  [facility] Urgent Care Center [facility] - at [employer]  [phone] [address] [place],  [place] [address]   [facility] Urgent Care at [facility] Get Driving Directions [phone] [address] [place], [place] [address]   [facility] Urgent Care at [facility] Get Driving Directions  [phone] [address] [place], [place] [address]   [facility] Urgent Care at [facility] Directions [phone] [address] [place], [place] [address]  Your MyChart  E-visit questionnaire answers were reviewed by a board certified advanced clinical practitioner to complete your personal care plan based on your specific symptoms.  Thank you for using e-Visits.   have provided 5 minutes of non face to face time during this encounter for chart review and documentation.

## 2023-11-11 ENCOUNTER — Ambulatory Visit: Payer: 59 | Admitting: Internal Medicine

## 2023-11-11 ENCOUNTER — Encounter: Payer: Self-pay | Admitting: Internal Medicine

## 2023-11-11 VITALS — BP 114/86 | HR 72 | Temp 98.4°F | Ht 66.75 in

## 2023-11-11 DIAGNOSIS — E038 Other specified hypothyroidism: Secondary | ICD-10-CM | POA: Diagnosis not present

## 2023-11-11 DIAGNOSIS — J019 Acute sinusitis, unspecified: Secondary | ICD-10-CM | POA: Insufficient documentation

## 2023-11-11 DIAGNOSIS — Z9189 Other specified personal risk factors, not elsewhere classified: Secondary | ICD-10-CM

## 2023-11-11 DIAGNOSIS — Z0000 Encounter for general adult medical examination without abnormal findings: Secondary | ICD-10-CM

## 2023-11-11 DIAGNOSIS — E559 Vitamin D deficiency, unspecified: Secondary | ICD-10-CM | POA: Diagnosis not present

## 2023-11-11 DIAGNOSIS — E88819 Insulin resistance, unspecified: Secondary | ICD-10-CM

## 2023-11-11 DIAGNOSIS — J0110 Acute frontal sinusitis, unspecified: Secondary | ICD-10-CM

## 2023-11-11 DIAGNOSIS — F39 Unspecified mood [affective] disorder: Secondary | ICD-10-CM

## 2023-11-11 LAB — COMPREHENSIVE METABOLIC PANEL
ALT: 34 U/L (ref 0–35)
AST: 27 U/L (ref 0–37)
Albumin: 4.9 g/dL (ref 3.5–5.2)
Alkaline Phosphatase: 71 U/L (ref 39–117)
BUN: 12 mg/dL (ref 6–23)
CO2: 28 meq/L (ref 19–32)
Calcium: 9.9 mg/dL (ref 8.4–10.5)
Chloride: 103 meq/L (ref 96–112)
Creatinine, Ser: 0.75 mg/dL (ref 0.40–1.20)
GFR: 89.44 mL/min (ref >60.00)
Glucose, Bld: 78 mg/dL (ref 70–99)
Potassium: 4.2 meq/L (ref 3.5–5.1)
Sodium: 140 meq/L (ref 135–145)
Total Bilirubin: 0.7 mg/dL (ref 0.2–1.2)
Total Protein: 8.4 g/dL — ABNORMAL HIGH (ref 6.0–8.3)

## 2023-11-11 LAB — TSH: TSH: 1.83 u[IU]/mL (ref 0.35–5.50)

## 2023-11-11 LAB — HEMOGLOBIN A1C: Hgb A1c MFr Bld: 5.6 % (ref 4.6–6.5)

## 2023-11-11 LAB — CBC
HCT: 45.1 % (ref 36.0–46.0)
Hemoglobin: 15.2 g/dL — ABNORMAL HIGH (ref 12.0–15.0)
MCHC: 33.7 g/dL (ref 30.0–36.0)
MCV: 88 fL (ref 78.0–100.0)
Platelets: 387 10*3/uL (ref 150.0–400.0)
RBC: 5.12 Mil/uL — ABNORMAL HIGH (ref 3.87–5.11)
RDW: 13.4 % (ref 11.5–15.5)
WBC: 7.8 10*3/uL (ref 4.0–10.5)

## 2023-11-11 LAB — T4, FREE: Free T4: 0.88 ng/dL (ref 0.60–1.60)

## 2023-11-11 LAB — VITAMIN D 25 HYDROXY (VIT D DEFICIENCY, FRACTURES): VITD: 50.2 ng/mL (ref 30.00–100.00)

## 2023-11-11 MED ORDER — AMOXICILLIN-POT CLAVULANATE 875-125 MG PO TABS: 1.0000 | ORAL_TABLET | Freq: Two times a day (BID) | ORAL | 0 refills | Status: AC

## 2023-11-11 NOTE — Assessment & Plan Note (Signed)
Checking TSH and free T4. Last year increased dose to 112 mcg levothyroxine daily. Adjust as needed.

## 2023-11-11 NOTE — Progress Notes (Signed)
   Subjective:   Patient ID: Linda Berger, female    DOB: 1965/11/19, 58 y.o.   MRN: [id number]  HPI The patient is here for physical. Also has acute sickness lately sinus symptoms since Dec 13th partially cleared then resumed about 1/6 and with severe sinus pain and drainage mild cough no SOB or wheezing.  PMH, FMH, social history reviewed and updated  Review of Systems  Constitutional:  Negative for activity change, appetite change, chills, fatigue, fever and unexpected weight change.  HENT:  Positive for congestion, postnasal drip, rhinorrhea, sinus pressure and sinus pain. Negative for ear discharge, ear pain, sneezing, sore throat, tinnitus, trouble swallowing and voice change.   Eyes: Negative.   Respiratory:  Positive for cough. Negative for chest tightness, shortness of breath and wheezing.   Cardiovascular: Negative.  Negative for chest pain, palpitations and leg swelling.  Gastrointestinal: Negative.  Negative for abdominal distention, abdominal pain, constipation, diarrhea, nausea and vomiting.  Musculoskeletal:  Positive for myalgias.  Skin: Negative.   Neurological: Negative.   Psychiatric/Behavioral: Negative.      Objective:  Physical Exam Constitutional:      Appearance: She is well-developed.  HENT:     Head: Normocephalic and atraumatic.     Ears:     Comments: Facial sinuses with severe tenderness and oropharynx red with drainage back of throat, ears with TM bulging clear fluid Cardiovascular:     Rate and Rhythm: Normal rate and regular rhythm.  Pulmonary:     Effort: Pulmonary effort is normal. No respiratory distress.     Breath sounds: Normal breath sounds. No wheezing or rales.  Abdominal:     General: Bowel sounds are normal. There is no distension.     Palpations: Abdomen is soft.     Tenderness: There is no abdominal tenderness. There is no rebound.  Musculoskeletal:     Cervical back: Normal range of motion.  Skin:    General: Skin is warm and  dry.  Neurological:     Mental Status: She is alert and oriented to person, place, and time.     Coordination: Coordination normal.     Vitals:   11/11/23 0804  BP: 114/86  Pulse: 72  Temp: 98.4 F (36.9 C)  TempSrc: Temporal  SpO2: 94%  Height: 5' 6.75" (1.695 m)    Assessment & Plan:

## 2023-11-11 NOTE — Assessment & Plan Note (Signed)
Flu shot up to date. Shingrix complete. Tetanus up to date. Colonoscopy up to date. Mammogram up to date, pap smear up to date. Counseled about sun safety and mole surveillance. Counseled about the dangers of distracted driving. Given 10 year screening recommendations.   

## 2023-11-11 NOTE — Assessment & Plan Note (Signed)
Checking HgA1c and adjust as needed.  

## 2023-11-11 NOTE — Addendum Note (Signed)
Addended by: Hillard Danker A on: 11/11/2023 09:08 AM   Modules accepted: Level of Service

## 2023-11-11 NOTE — Assessment & Plan Note (Signed)
Checking vitamin D level today.  

## 2023-11-11 NOTE — Assessment & Plan Note (Signed)
New sinus pain and pressure with drainage 2-3 weeks without relief. Taking claritin and flonase without relief. Rx augmentin 10 day course.

## 2023-11-11 NOTE — Assessment & Plan Note (Signed)
Doing well with lexapro and controlled. Will continue.

## 2023-11-11 NOTE — Assessment & Plan Note (Signed)
Very strong family history checking HgA1c for screening.

## 2023-11-16 ENCOUNTER — Encounter: Payer: Self-pay | Admitting: Internal Medicine

## 2024-01-04 ENCOUNTER — Other Ambulatory Visit: Payer: Self-pay | Admitting: Internal Medicine

## 2024-01-09 ENCOUNTER — Encounter: Payer: Self-pay | Admitting: Internal Medicine

## 2024-01-09 DIAGNOSIS — E559 Vitamin D deficiency, unspecified: Secondary | ICD-10-CM

## 2024-01-11 MED ORDER — VITAMIN D (ERGOCALCIFEROL) 1.25 MG (50000 UNIT) PO CAPS: ORAL_CAPSULE | ORAL | 2 refills | Status: DC

## 2024-03-15 ENCOUNTER — Other Ambulatory Visit: Payer: Self-pay | Admitting: Nurse Practitioner

## 2024-03-15 DIAGNOSIS — Z1231 Encounter for screening mammogram for malignant neoplasm of breast: Secondary | ICD-10-CM

## 2024-04-27 ENCOUNTER — Ambulatory Visit
Admission: RE | Admit: 2024-04-27 | Discharge: 2024-04-27 | Disposition: A | Discharge status: Home / Self Care | Source: Ambulatory Visit | Attending: Nurse Practitioner

## 2024-04-27 DIAGNOSIS — Z1231 Encounter for screening mammogram for malignant neoplasm of breast: Secondary | ICD-10-CM

## 2024-06-02 ENCOUNTER — Ambulatory Visit: Payer: Self-pay | Admitting: Nurse Practitioner

## 2024-07-26 ENCOUNTER — Other Ambulatory Visit (HOSPITAL_COMMUNITY)
Admission: RE | Admit: 2024-07-26 | Discharge: 2024-07-26 | Disposition: A | Source: Ambulatory Visit | Attending: Nurse Practitioner | Admitting: Nurse Practitioner

## 2024-07-26 ENCOUNTER — Encounter: Payer: Self-pay | Admitting: Nurse Practitioner

## 2024-07-26 ENCOUNTER — Ambulatory Visit: Payer: Self-pay | Admitting: Nurse Practitioner

## 2024-07-26 VITALS — BP 116/78 | HR 90 | Ht 66.25 in | Wt 210.0 lb

## 2024-07-26 DIAGNOSIS — Z124 Encounter for screening for malignant neoplasm of cervix: Secondary | ICD-10-CM | POA: Insufficient documentation

## 2024-07-26 DIAGNOSIS — Z01419 Encounter for gynecological examination (general) (routine) without abnormal findings: Secondary | ICD-10-CM | POA: Insufficient documentation

## 2024-07-26 DIAGNOSIS — Z1331 Encounter for screening for depression: Secondary | ICD-10-CM | POA: Diagnosis not present

## 2024-07-26 DIAGNOSIS — Z79890 Hormone replacement therapy (postmenopausal): Secondary | ICD-10-CM

## 2024-07-26 MED ORDER — PROGESTERONE MICRONIZED 100 MG PO CAPS: 100.0000 mg | ORAL_CAPSULE | Freq: Every evening | ORAL | 1 refills | Status: DC

## 2024-07-26 MED ORDER — ESTRADIOL 0.025 MG/24HR TD PTWK: 0.0250 mg | MEDICATED_PATCH | TRANSDERMAL | 1 refills | Status: DC

## 2024-07-26 NOTE — Progress Notes (Signed)
 DULCY SIDA July 06, 1966 [id number]   History:  58 y.o. G0 presents for annual exam without GYN complaints. Postmenopausal - no HRT, no bleeding. Complains of hot flashes, insomnia, vaginal dryness and hair loss. Has been using OTC Replens 3 times per week. 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 History of hypothyroidism, vitamin D  deficiency. Started Tzhncb yesterday, being managed at weight loss clinic.   Gynecologic History Patient's last menstrual period was 10/26/2020.   Contraception/Family planning: post menopausal status Sexually active: Yes  Health Maintenance Last Pap: 04/13/2019. Results were: Normal neg HPV Last mammogram: 04/27/2024. Results were: Normal Last colonoscopy: 07/20/2018. Results were: Normal, 10-year recall Last Dexa: Not indicated     07/26/2024    3:14 PM  Depression screen PHQ 2/9  Decreased Interest 0  Down, Depressed, Hopeless 0  PHQ - 2 Score 0     Past medical history, past surgical history, family history and social history were all reviewed and documented in the EPIC chart. 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   ROS:  A ROS was performed and pertinent positives and negatives are included.  Exam:  Vitals:   07/26/24 1510  BP: 116/78  Pulse: 90  SpO2: 99%  Weight: 210 lb (95.3 kg)  Height: 5' 6.25 (1.683 m)      Body mass index is 33.64 kg/m.  General appearance:  Normal Thyroid :  Symmetrical, normal in size, without palpable masses or nodularity. Respiratory  Auscultation:  Clear without wheezing or rhonchi Cardiovascular  Auscultation:  Regular rate, without rubs, murmurs or gallops  Edema/varicosities:  Not grossly evident Abdominal  Soft,nontender, without masses, guarding or rebound.  Liver/spleen:  No organomegaly noted  Hernia:  None appreciated  Skin  Inspection:  Grossly normal Breasts: Examined lying and  sitting.   Right: Without masses, retractions, nipple discharge or axillary adenopathy.   Left: Without masses, retractions, nipple discharge or axillary adenopathy. Pelvic: External genitalia:  no lesions              Urethra:  normal appearing urethra with no masses, tenderness or lesions              Bartholins and Skenes: normal                 Vagina: normal appearing vagina with normal color and discharge, no lesions              Cervix: no lesions Bimanual Exam:  Uterus:  no masses or tenderness              Adnexa: no mass, fullness, tenderness              Rectovaginal: Deferred              Anus:  normal, no lesions  Zada Louder, CMA present as chaperone.   Assessment/Plan:  58 y.o. G0 for annual exam.   Well female exam with routine gynecological exam - Education provided on SBEs, importance of preventative screenings, current guidelines, high calcium diet, regular exercise, and multivitamin daily. Labs with PCP.   Postmenopausal hormone therapy - Plan: progesterone (PROMETRIUM) 100 MG capsule nightly, estradiol  (CLIMARA ) 0.025 mg/24hr patch weekly. Educated on risks and benefits of use.   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 Pap today per guidelines.   Screening for breast cancer - Normal mammogram history.  Continue annual screenings.  Normal breast exam today.  Screening for colon cancer - 2019 colonoscopy. Will repeat at GI's recommended interval.   Screening for osteoporosis - Average risk. Will start screenings at age 60.   Return in about 4 weeks (around 08/23/2024) for Med follow up.      Annabella DELENA Shutter DNP, 3:34 PM 07/26/2024

## 2024-07-27 ENCOUNTER — Ambulatory Visit: Payer: Self-pay | Admitting: Nurse Practitioner

## 2024-07-27 LAB — CYTOLOGY - PAP
Comment: NEGATIVE
Diagnosis: NEGATIVE
High risk HPV: NEGATIVE

## 2024-07-27 LAB — LAB REPORT - SCANNED: EGFR: 79

## 2024-08-18 ENCOUNTER — Other Ambulatory Visit: Payer: Self-pay | Admitting: Nurse Practitioner

## 2024-08-18 DIAGNOSIS — Z79890 Hormone replacement therapy (postmenopausal): Secondary | ICD-10-CM

## 2024-08-18 NOTE — Telephone Encounter (Signed)
 Called Pt to explain the reason why TW denied the refill request:  Pt will be coming in on 08/26/24, and TW may end up changing the dosage. TW did not want Pt to waste her money. Pt stated she understood and is very appreciative of the information and said she will see us  next week.

## 2024-08-18 NOTE — Telephone Encounter (Signed)
 Med refill request:   progesterone (PROMETRIUM) 100 MG capsule  Start:  07/26/24 Disp:   30 tablets Refills:  1  *90 Day supply requested*  Last AEX:  07/26/24 Next OV:   08/26/24 Next AEX:  07/27/25 Last MMG (if hormonal med):  04/27/24 Refill authorized? Please Advise.

## 2024-08-22 ENCOUNTER — Encounter: Payer: Self-pay | Admitting: Internal Medicine

## 2024-08-26 ENCOUNTER — Encounter: Payer: Self-pay | Admitting: Nurse Practitioner

## 2024-08-26 ENCOUNTER — Ambulatory Visit: Admitting: Nurse Practitioner

## 2024-08-26 VITALS — BP 112/72 | HR 77

## 2024-08-26 DIAGNOSIS — Z79890 Hormone replacement therapy (postmenopausal): Secondary | ICD-10-CM | POA: Diagnosis not present

## 2024-08-26 MED ORDER — ESTRADIOL 0.0375 MG/24HR TD PTWK: 0.0375 mg | MEDICATED_PATCH | TRANSDERMAL | 0 refills | Status: DC

## 2024-08-26 MED ORDER — ESTRADIOL 0.0375 MG/24HR TD PTWK: 0.0375 mg | MEDICATED_PATCH | TRANSDERMAL | 3 refills | Status: DC

## 2024-08-26 MED ORDER — PROGESTERONE MICRONIZED 100 MG PO CAPS: 100.0000 mg | ORAL_CAPSULE | Freq: Every evening | ORAL | 3 refills | Status: AC

## 2024-08-26 NOTE — Progress Notes (Signed)
   Acute Office Visit  Subjective:    Patient ID: Linda Berger, female    DOB: [birth year], 58 y.o.   MRN: [id number]   HPI 58 y.o. presents today for medication follow up. Started HRT last month for hot flashes, insomnia, vaginal dryness and hair loss. Was using OTC Replens 3 times per week. Doing estradiol  patch 0.025, Prometrium 100 mg nightly. Has noticed much improvement in symptoms. Still having a few hot flashes per day. Has not had to use Replens.   Patient's last menstrual period was 10/26/2020.    Review of Systems  Constitutional: Negative.   Endocrine: Positive for heat intolerance.  Psychiatric/Behavioral: Negative.         Objective:    Physical Exam Constitutional:      Appearance: Normal appearance.     BP 112/72 (BP Location: Right Arm, Patient Position: Sitting, Cuff Size: Normal)   Pulse 77   LMP 10/26/2020   SpO2 97%  Wt Readings from Last 3 Encounters:  07/26/24 210 lb (95.3 kg)  08/26/22 205 lb (93 kg)  08/20/21 221 lb (100.2 kg)        Assessment & Plan:   Problem List Items Addressed This Visit   None Visit Diagnoses       Postmenopausal hormone therapy    -  Primary   Relevant Medications   estradiol  (CLIMARA ) 0.0375 mg/24hr patch   estradiol  (CLIMARA ) 0.0375 mg/24hr patch   progesterone (PROMETRIUM) 100 MG capsule      Plan: Increase estradiol  to 0.0375 mg, continue Prometrium nightly. 1 month supply sent to local pharmacy and refills sent to [facility].   Return if symptoms worsen or fail to improve.    Linda DELENA Shutter DNP, 9:31 AM 08/26/2024

## 2024-09-09 ENCOUNTER — Other Ambulatory Visit: Payer: Self-pay | Admitting: Internal Medicine

## 2024-09-09 DIAGNOSIS — E559 Vitamin D deficiency, unspecified: Secondary | ICD-10-CM

## 2024-10-07 ENCOUNTER — Other Ambulatory Visit: Payer: Self-pay | Admitting: Nurse Practitioner

## 2024-10-07 DIAGNOSIS — Z79890 Hormone replacement therapy (postmenopausal): Secondary | ICD-10-CM

## 2024-10-07 NOTE — Telephone Encounter (Signed)
 Medication refill request: estradiol  patch 0.0375mg  Last AEX:  07-26-24 Next AEX: 07-27-25 Last MMG (if hormonal medication request): 04-27-24 Refill authorized: this rx denied. Only 1 mth supply was sent to [facility] at visit. 90 day supply was sent to [facility] mail order.

## 2024-10-10 ENCOUNTER — Encounter: Payer: Self-pay | Admitting: Nurse Practitioner

## 2024-10-11 MED ORDER — ESTRADIOL 0.025 MG/24HR TD PTTW: 1.0000 | MEDICATED_PATCH | TRANSDERMAL | 3 refills | Status: AC

## 2024-10-11 NOTE — Telephone Encounter (Signed)
 I recommend spraying flonase  on the skin and letting it dry before applying the patch, we could also decrease to the lower doses again.

## 2024-11-02 LAB — LIPID PANEL
Cholesterol: 118 (ref 0–200)
HDL: 49 (ref 35–70)
LDL Cholesterol: 51
LDl/HDL Ratio: 2.4

## 2024-11-02 LAB — BASIC METABOLIC PANEL WITH GFR: Glucose: 83

## 2024-11-03 ENCOUNTER — Encounter: Payer: Self-pay | Admitting: Internal Medicine

## 2025-07-27 ENCOUNTER — Ambulatory Visit: Admitting: Nurse Practitioner
